# Patient Record
Sex: Female | Born: 1940
Health system: Southern US, Community
[De-identification: ages and names within clinical notes are randomized; demographics above are authoritative.]

## PROBLEM LIST (undated history)

## (undated) DIAGNOSIS — N644 Mastodynia: Secondary | ICD-10-CM

## (undated) DIAGNOSIS — B20 Human immunodeficiency virus [HIV] disease: Secondary | ICD-10-CM

## (undated) DIAGNOSIS — E785 Hyperlipidemia, unspecified: Secondary | ICD-10-CM

## (undated) DIAGNOSIS — Z21 Asymptomatic human immunodeficiency virus [HIV] infection status: Secondary | ICD-10-CM

## (undated) DIAGNOSIS — M1990 Unspecified osteoarthritis, unspecified site: Secondary | ICD-10-CM

## (undated) DIAGNOSIS — E119 Type 2 diabetes mellitus without complications: Secondary | ICD-10-CM

## (undated) DIAGNOSIS — K219 Gastro-esophageal reflux disease without esophagitis: Secondary | ICD-10-CM

## (undated) DIAGNOSIS — C884 Extranodal marginal zone b-cell lymphoma of mucosa-associated lymphoid tissue (malt-lymphoma) not having achieved remission: Secondary | ICD-10-CM

## (undated) DIAGNOSIS — I10 Essential (primary) hypertension: Secondary | ICD-10-CM

## (undated) HISTORY — PX: TUBAL LIGATION: SHX77

## (undated) HISTORY — PX: CATARACT EXTRACTION W/ INTRAOCULAR LENS  IMPLANT, BILATERAL: SHX1307

---

## 1983-11-28 HISTORY — PX: BREAST BIOPSY: SHX20

## 2006-07-13 ENCOUNTER — Ambulatory Visit: Payer: Self-pay | Admitting: Infectious Diseases

## 2006-07-13 ENCOUNTER — Other Ambulatory Visit: Payer: Self-pay

## 2006-08-31 ENCOUNTER — Ambulatory Visit: Payer: Self-pay | Admitting: Gastroenterology

## 2007-11-05 ENCOUNTER — Ambulatory Visit: Payer: Self-pay | Admitting: Specialist

## 2008-11-24 ENCOUNTER — Ambulatory Visit: Payer: Self-pay | Admitting: Specialist

## 2009-11-25 ENCOUNTER — Ambulatory Visit: Payer: Self-pay | Admitting: Specialist

## 2010-02-16 ENCOUNTER — Ambulatory Visit: Payer: Self-pay | Admitting: Specialist

## 2010-12-06 ENCOUNTER — Ambulatory Visit: Payer: Self-pay | Admitting: Specialist

## 2011-11-29 ENCOUNTER — Other Ambulatory Visit: Payer: Self-pay | Admitting: *Deleted

## 2011-11-30 MED ORDER — ENALAPRIL MALEATE 20 MG PO TABS: 20.0000 mg | ORAL_TABLET | Freq: Every day | ORAL | Status: DC

## 2011-12-26 ENCOUNTER — Ambulatory Visit: Payer: Self-pay | Admitting: Specialist

## 2012-02-02 DIAGNOSIS — I10 Essential (primary) hypertension: Secondary | ICD-10-CM | POA: Insufficient documentation

## 2012-02-02 DIAGNOSIS — Z21 Asymptomatic human immunodeficiency virus [HIV] infection status: Secondary | ICD-10-CM | POA: Insufficient documentation

## 2012-02-02 DIAGNOSIS — B20 Human immunodeficiency virus [HIV] disease: Secondary | ICD-10-CM | POA: Insufficient documentation

## 2012-04-01 ENCOUNTER — Other Ambulatory Visit: Payer: Self-pay | Admitting: Internal Medicine

## 2012-04-01 NOTE — Telephone Encounter (Signed)
Patient has not been seen here.

## 2012-04-08 MED ORDER — ENALAPRIL MALEATE 20 MG PO TABS: 20.0000 mg | ORAL_TABLET | Freq: Every day | ORAL | Status: DC

## 2012-04-08 NOTE — Telephone Encounter (Signed)
Refills authorized to August. No more after that without appt

## 2012-05-27 DIAGNOSIS — E119 Type 2 diabetes mellitus without complications: Secondary | ICD-10-CM | POA: Insufficient documentation

## 2012-06-17 ENCOUNTER — Ambulatory Visit: Payer: Self-pay | Admitting: Specialist

## 2012-06-27 ENCOUNTER — Ambulatory Visit: Payer: Self-pay | Admitting: Specialist

## 2012-07-28 ENCOUNTER — Ambulatory Visit: Payer: Self-pay | Admitting: Specialist

## 2012-08-26 ENCOUNTER — Telehealth: Payer: Self-pay | Admitting: Internal Medicine

## 2012-08-26 NOTE — Telephone Encounter (Signed)
AV#[id number] Enalapril maleate 20mg  tab 30.0 ea thirty Refills 3 Take 1 table (20mg  total) by mouth daily

## 2012-08-28 NOTE — Telephone Encounter (Signed)
Denied. Patient has not been seen. Last refill stated she needed appt before more refills. None scheduled.

## 2013-01-10 ENCOUNTER — Ambulatory Visit: Payer: Self-pay | Admitting: Specialist

## 2013-10-09 ENCOUNTER — Ambulatory Visit: Payer: Self-pay | Admitting: Gastroenterology

## 2013-11-06 ENCOUNTER — Ambulatory Visit: Payer: Self-pay | Admitting: Gastroenterology

## 2014-01-14 ENCOUNTER — Ambulatory Visit: Payer: Self-pay

## 2015-01-15 ENCOUNTER — Ambulatory Visit: Payer: Self-pay

## 2015-10-12 ENCOUNTER — Other Ambulatory Visit: Payer: Self-pay | Admitting: Infectious Diseases

## 2015-10-12 DIAGNOSIS — N644 Mastodynia: Secondary | ICD-10-CM

## 2015-10-28 ENCOUNTER — Ambulatory Visit
Admission: RE | Admit: 2015-10-28 | Discharge: 2015-10-28 | Disposition: A | Discharge status: Home / Self Care | Payer: Managed Care, Other (non HMO) | Source: Ambulatory Visit | Attending: Infectious Diseases | Admitting: Infectious Diseases

## 2015-10-28 ENCOUNTER — Other Ambulatory Visit: Payer: Self-pay | Admitting: Infectious Diseases

## 2015-10-28 DIAGNOSIS — R921 Mammographic calcification found on diagnostic imaging of breast: Secondary | ICD-10-CM | POA: Diagnosis not present

## 2015-10-28 DIAGNOSIS — N644 Mastodynia: Secondary | ICD-10-CM

## 2015-10-28 HISTORY — DX: Mastodynia: N64.4

## 2016-02-10 ENCOUNTER — Encounter
Admission: RE | Admit: 2016-02-10 | Discharge: 2016-02-10 | Disposition: A | Discharge status: Home / Self Care | Payer: Managed Care, Other (non HMO) | Source: Ambulatory Visit | Attending: Obstetrics and Gynecology | Admitting: Obstetrics and Gynecology

## 2016-02-10 DIAGNOSIS — Z01810 Encounter for preprocedural cardiovascular examination: Secondary | ICD-10-CM | POA: Insufficient documentation

## 2016-02-10 DIAGNOSIS — I519 Heart disease, unspecified: Secondary | ICD-10-CM | POA: Insufficient documentation

## 2016-02-10 DIAGNOSIS — I10 Essential (primary) hypertension: Secondary | ICD-10-CM | POA: Diagnosis not present

## 2016-02-10 DIAGNOSIS — E119 Type 2 diabetes mellitus without complications: Secondary | ICD-10-CM | POA: Diagnosis not present

## 2016-02-10 DIAGNOSIS — Z01812 Encounter for preprocedural laboratory examination: Secondary | ICD-10-CM | POA: Insufficient documentation

## 2016-02-10 HISTORY — DX: Unspecified osteoarthritis, unspecified site: M19.90

## 2016-02-10 HISTORY — DX: Type 2 diabetes mellitus without complications: E11.9

## 2016-02-10 HISTORY — DX: Extranodal marginal zone b-cell lymphoma of mucosa-associated lymphoid tissue (malt-lymphoma) not having achieved remission: C88.40

## 2016-02-10 HISTORY — DX: Asymptomatic human immunodeficiency virus (hiv) infection status: Z21

## 2016-02-10 HISTORY — DX: Extranodal marginal zone B-cell lymphoma of mucosa-associated lymphoid tissue (MALT-lymphoma): C88.4

## 2016-02-10 HISTORY — DX: Human immunodeficiency virus (HIV) disease: B20

## 2016-02-10 HISTORY — DX: Essential (primary) hypertension: I10

## 2016-02-10 HISTORY — DX: Hyperlipidemia, unspecified: E78.5

## 2016-02-10 LAB — TYPE AND SCREEN
ABO/RH(D): O POS
Antibody Screen: NEGATIVE

## 2016-02-10 LAB — CBC
HEMATOCRIT: 36.6 % (ref 35.0–47.0)
Hemoglobin: 12.5 g/dL (ref 12.0–16.0)
MCH: 37.3 pg — AB (ref 26.0–34.0)
MCHC: 34.1 g/dL (ref 32.0–36.0)
MCV: 109.3 fL — AB (ref 80.0–100.0)
PLATELETS: 225 10*3/uL (ref 150–440)
RBC: 3.35 MIL/uL — AB (ref 3.80–5.20)
RDW: 13.8 % (ref 11.5–14.5)
WBC: 5.3 10*3/uL (ref 3.6–11.0)

## 2016-02-10 LAB — BASIC METABOLIC PANEL
ANION GAP: 8 (ref 5–15)
BUN: 11 mg/dL (ref 6–20)
CALCIUM: 9.4 mg/dL (ref 8.9–10.3)
CO2: 25 mmol/L (ref 22–32)
Chloride: 103 mmol/L (ref 101–111)
Creatinine, Ser: 0.77 mg/dL (ref 0.44–1.00)
GLUCOSE: 85 mg/dL (ref 65–99)
POTASSIUM: 3.5 mmol/L (ref 3.5–5.1)
Sodium: 136 mmol/L (ref 135–145)

## 2016-02-10 LAB — APTT: APTT: 31 s (ref 24–36)

## 2016-02-10 LAB — PROTIME-INR
INR: 1.02
Prothrombin Time: 13.6 seconds (ref 11.4–15.0)

## 2016-02-10 NOTE — H&P (Signed)
[name] is a 75 y.o. female here for Pre Op Consulting .  HPI:  Pt presents for a preoperative visit to schedule a D&C, hysteroscopy, and polypectomy.  She has a hx of: postmenopausal bleeding  Workup has included:  The patient underwent a preliminary transvaginal pelvic sonogram. Saline u/s Endometrial polyp seen on saline exam=0.94 x 0.62 x 0.80 cm  Endometrium=8.88 mm Lt ant fibroid seen=25.3 mm bil ovs wnl   Pt has a hx of HIV+  Today worried about a dark bruise-like place in her left groin fold.Is seeing ID in 2 weeks. Has been putting a gel on it, prescription, but not listed in her medications and she can't remember the name. Is itchy, but not palapable.   Past Medical History:  has a past medical history of Abnormal LFTs (09/2013); Allergic rhinitis, cause unspecified; Diabetes mellitus type 2, uncomplicated (CMS-HCC); H/O tubal ligation; HIV infection (CMS-HCC) (1998); Hyperlipidemia, unspecified; Hypertension; MALT lymphoma (CMS-HCC); and Osteoarthrosis, unspecified whether generalized or localized, unspecified site.  Past Surgical History:  has a past surgical history that includes Breast excisional biopsy. Family History: family history includes Breast cancer in her mother; Diabetes type II in her brother; Hypertension in her brother; Pulmonary embolism in her father. Social History:  reports that she has never smoked. She has never used smokeless tobacco. She reports that she does not drink alcohol or use illicit drugs. OB/GYN History:  OB History    Gravida Para Term Preterm AB TAB SAB Ectopic Multiple Living   5 5 5       5       Allergies: has No Known Allergies. Medications:  Current Outpatient Prescriptions:  . amLODIPine (NORVASC) 2.5 MG tablet, Take 1 tablet (2.5 mg total) by mouth once daily., Disp: 90 tablet, Rfl: 3 . BESIVANCE 0.6 %, 1 DROP IN RIGHT EYE THREE TIMES A DAY, Disp: , Rfl: 1 . blood glucose diagnostic (ONETOUCH ULTRA  TEST) test strip, Use 1 each 2 (two) times daily. Use as instructed., Disp: 100 each, Rfl: 11 . calcium carb-vit D2-soybean 600-200-25 mg-unit-mg Tab, Take by mouth., Disp: , Rfl:  . clindamycin (CLINDAGEL) 1 % topical gel, Apply topically 2 (two) times daily. Apply to axilla two times a day, Disp: 30 g, Rfl: 11 . DUREZOL 0.05 % ophthalmic emulsion, PLACE 1 DROP IN RIGHT EYE THREE TIMES A DAY, Disp: , Rfl: 1 . enalapril (VASOTEC) 20 MG tablet, Take 1 tablet (20 mg total) by mouth once daily., Disp: 90 tablet, Rfl: 2 . glipiZIDE (GLUCOTROL) 5 MG tablet, Take 1 tablet (5 mg total) by mouth every morning before breakfast., Disp: 90 tablet, Rfl: 1 . ILEVRO 0.3 % DrpS, 1 DROP IN RIGHT EYE AT BEDTIME, Disp: , Rfl: 1 . lamiVUDine-zidovudine (COMBIVIR) 150-300 mg tablet, TAKE 1 TABLET DAILY, Disp: 90 tablet, Rfl: 1 . lancets (ONETOUCH DELICA LANCETS) 33 gauge Misc, Use 1 each 2 (two) times daily., Disp: 200 each, Rfl: 2 . lovastatin (MEVACOR) 40 MG tablet, Take 1 tablet (40 mg total) by mouth daily with dinner., Disp: 90 tablet, Rfl: 2 . metFORMIN (GLUMETZA) 500 MG (MOD) ER tablet, Take 1 tablet (500 mg total) by mouth 2 (two) times daily., Disp: 60 tablet, Rfl: 1 . metoprolol succinate (TOPROL-XL) 50 MG XL tablet, Take 2 tablets (100 mg total) by mouth once daily., Disp: 180 tablet, Rfl: 2 . omeprazole (PRILOSEC) 40 MG DR capsule, Take 1 capsule (40 mg total) by mouth once daily., Disp: 90 capsule, Rfl: 1 . predniSONE (DELTASONE) 20 MG tablet,  Take 3 daily for 2 days then 2 daily for 2 days then 1 daily for 2 days, Disp: 12 tablet, Rfl: 0 . raltegravir (ISENTRESS) 400 mg tablet, Take 1 tablet (400 mg total) by mouth 2 (two) times daily., Disp: 180 tablet, Rfl: 1  Review of Systems: See HPI   Exam:      Vitals:   01/18/16 1454  BP: 159/86  Pulse: 48    WDWN black female in NAD Body mass index is 25.71 kg/(m^2). Lungs: CTA  CV : RRR without murmur  Breast: deferred Neck: no  thyromegaly Abdomen: soft , no mass, normal active bowel sounds, non-tender, no rebound tenderness  Pelvic: External genitalia: vulva /labia no lesions, Tanner stage 5 Urethra: no prolapse Vagina: normal physiologic d/c Cervix: no lesions, no cervical motion tenderness  Uterus: normal size shape and contour, non-tender Adnexa: no mass, non-tender  Rectovaginal: external exam normal  Impression:   The primary encounter diagnosis was Preop examination. Diagnoses of Post-menopausal bleeding, Thickened endometrium, and Endometrial polyp were also pertinent to this visit.    Plan:   - Preoperative visit: D&C hysteroscopy polypectomy. Consents signed today. Risks of surgery were discussed with the patient including but not limited to: bleeding which may require transfusion; infection which may require antibiotics; injury to uterus or surrounding organs; intrauterine scarring which may impair future fertility; need for additional procedures including laparotomy or laparoscopy; and other postoperative/anesthesia complications. Written informed consent was obtained.  This is a scheduled same-day surgery. She will have a postop visit in 2 weeks to review operative findings and pathology.  - Will get coagulation factors on preop for bruised place on hip.

## 2016-02-10 NOTE — Pre-Procedure Instructions (Signed)
REVIEWED  EKG WITH DR Marcello Moores, NO CLEARANCE REQUESTED

## 2016-02-10 NOTE — Patient Instructions (Signed)
  Your procedure is scheduled on: Thursday 02/18/16 Report to Day Surgery. 2ND FLOOR MEDICAL MALL ENTRANCE To find out your arrival time please call [phone] between 1PM - 3PM on Wednesday 02/17/16.  Remember: Instructions that are not followed completely may result in serious medical risk, up to and including death, or upon the discretion of your surgeon and anesthesiologist your surgery may need to be rescheduled.    ____ 1. Do not eat food or drink liquids after midnight. No gum chewing or hard candies.     ____ 2. No Alcohol for 24 hours before or after surgery.   ____ 3. Bring all medications with you on the day of surgery if instructed.    ____ 4. Notify your doctor if there is any change in your medical condition     (cold, fever, infections).     Do not wear jewelry, make-up, hairpins, clips or nail polish.  Do not wear lotions, powders, or perfumes. You may wear deodorant.  Do not shave 48 hours prior to surgery. Men may shave face and neck.  Do not bring valuables to the hospital.    [facility] is not responsible for any belongings or valuables.               Contacts, dentures or bridgework may not be worn into surgery.  Leave your suitcase in the car. After surgery it may be brought to your room.  For patients admitted to the hospital, discharge time is determined by your                treatment team.   Patients discharged the day of surgery will not be allowed to drive home.   Please read over the following fact sheets that you were given:   Surgical Site Infection Prevention   __X__ Take these medicines the morning of surgery with A SIP OF WATER:    1. ENALAPRIL  2. LAMIVUDINE-ZIDOVUDINE  3. LOVASTATIN  4. METOPROLOL  5. OMEPRAZOLE  6. RALTEGRAVIR  ____ Fleet Enema (as directed)   ____ Use CHG Soap as directed  ____ Use inhalers on the day of surgery  __X__ Stop metformin 2 days prior to surgery    ____ Take 1/2 of usual insulin dose the night before  surgery and none on the morning of surgery.   __X__ Stop Coumadin/Plavix/aspirin on TODAY  __X__ Stop Anti-inflammatories on TODAY   ____ Stop supplements until after surgery.    ____ Bring C-Pap to the hospital.

## 2016-02-11 LAB — ABO/RH: ABO/RH(D): O POS

## 2016-02-14 NOTE — Pre-Procedure Instructions (Signed)
[place] FOR EKG CALLED AND FAXED AS REQUESTED BY DR J ADAMS TO DR [facility] OFFICE. SPOKE WITH DAWN ALSO CALLED AND FAXED TO DR BEASLEY OFFICE. SPOKE [place]

## 2016-02-15 NOTE — Pre-Procedure Instructions (Signed)
DR Ola Spurr REFERRED TO CARDIOLOGY FOR CLEARANCE

## 2016-02-17 NOTE — Pre-Procedure Instructions (Signed)
, Aloha Gell, MD - 02/17/2016 2:00 PM EDT Formatting of this note may be different from the original.   Chief Complaint: Chief Complaint  Patient presents with  . Establish Care  . Pre-op Exam  abnormal ekg  Date of Service: 02/17/2016 Date of Birth: 1941-11-16 PCP: DAVID Ashley Akin, MD  History of Present Illness: Ms. Tiet is a 75 y.o.female patient who presents for evaluation and risk stratification prior to elective surgery after preoperative electrocardiogram was felt to be abnormal. EKG done in March of this year revealed normal sinus rhythm at a rate of 63 with a PR interval of 158 ms, QRS duration 74 ms with a QTC of 399 ms with a QRS axis -8. There were nonspecific ST-T wave changes. Patient denies any significant chest pain. She remains fairly active without difficulty. She has a family history of heart disease and also has hypertension and hyperlipidemia as well as diabetes mellitus.  Past Medical and Surgical History  Past Medical History Past Medical History  Diagnosis Date  . Abnormal LFTs 09/2013  Follows with Dr. Rayann Heman  . Allergic rhinitis, cause unspecified  . Diabetes mellitus type 2, uncomplicated (CMS-HCC)  . H/O tubal ligation  Bilateral  . HIV infection (CMS-HCC) 1998  On treatment since diagnosis. Heterosexually acquired. No OIs.  Marland Kitchen Hyperlipidemia, unspecified  . Hypertension  . MALT lymphoma (CMS-HCC)  . Osteoarthrosis, unspecified whether generalized or localized, unspecified site   Past Surgical History She has a past surgical history that includes Breast excisional biopsy.   Medications and Allergies  Current Medications  Current Outpatient Prescriptions  Medication Sig Dispense Refill  . amLODIPine (NORVASC) 2.5 MG tablet Take 1 tablet (2.5 mg total) by mouth once daily. 90 tablet 3  . BESIVANCE 0.6 % 1 DROP IN RIGHT EYE THREE TIMES A DAY 1  . blood glucose diagnostic (ONETOUCH ULTRA TEST) test strip Use 1 each 2 (two) times daily. Use  as instructed. 100 each 11  . calcium carb-vit D2-soybean 600-200-25 mg-unit-mg Tab Take by mouth.  . DUREZOL 0.05 % ophthalmic emulsion PLACE 1 DROP IN RIGHT EYE THREE TIMES A DAY 1  . enalapril (VASOTEC) 20 MG tablet Take 1 tablet (20 mg total) by mouth once daily. 90 tablet 2  . glipiZIDE (GLUCOTROL) 5 MG tablet Take 1 tablet (5 mg total) by mouth every morning before breakfast. 90 tablet 1  . ILEVRO 0.3 % DrpS 1 DROP IN RIGHT EYE AT BEDTIME 1  . lamiVUDine-zidovudine (COMBIVIR) 150-300 mg tablet TAKE 1 TABLET DAILY 90 tablet 1  . lancets (ONETOUCH DELICA LANCETS) 33 gauge Misc Use 1 each 2 (two) times daily. 200 each 2  . lovastatin (MEVACOR) 40 MG tablet Take 1 tablet (40 mg total) by mouth daily with dinner. 90 tablet 2  . metFORMIN (GLUMETZA) 500 MG (MOD) ER tablet Take 1 tablet (500 mg total) by mouth 2 (two) times daily. 60 tablet 1  . metoprolol succinate (TOPROL-XL) 50 MG XL tablet Take 2 tablets (100 mg total) by mouth once daily. 180 tablet 2  . nystatin (MYCOSTATIN) 100,000 unit/gram ointment Apply topically 2 (two) times daily. 30 g 3  . omeprazole (PRILOSEC) 40 MG DR capsule Take 1 capsule (40 mg total) by mouth once daily. 90 capsule 1  . predniSONE (DELTASONE) 20 MG tablet Take 3 daily for 2 days then 2 daily for 2 days then 1 daily for 2 days 12 tablet 0  . raltegravir (ISENTRESS) 400 mg tablet Take 1 tablet (400 mg total) by  mouth 2 (two) times daily. 180 tablet 1   No current facility-administered medications for this visit.   Allergies: Review of patient's allergies indicates no known allergies.  Social and Family History  Social History reports that she has never smoked. She has never used smokeless tobacco. She reports that she does not drink alcohol or use illicit drugs.  Family History Family History  Problem Relation Age of Onset  . Pulmonary embolism Father  . Breast cancer Mother  . Diabetes type II Brother  . Hypertension Brother   Review of Systems   Review of Systems  Constitutional: Negative for chills, diaphoresis, fever, malaise/fatigue and weight loss.  HENT: Negative for congestion, ear discharge, hearing loss and tinnitus.  Eyes: Negative for blurred vision.  Respiratory: Negative for cough, hemoptysis, sputum production, shortness of breath and wheezing.  Cardiovascular: Positive for chest pain. Negative for palpitations, orthopnea, claudication, leg swelling and PND.  Gastrointestinal: Negative for abdominal pain, blood in stool, constipation, diarrhea, heartburn, melena, nausea and vomiting.  Genitourinary: Negative for dysuria, frequency, hematuria and urgency.  Musculoskeletal: Negative for back pain, falls, joint pain and myalgias.  Skin: Negative for itching and rash.  Neurological: Negative for dizziness, tingling, focal weakness, loss of consciousness, weakness and headaches.  Endo/Heme/Allergies: Negative for polydipsia. Does not bruise/bleed easily.  Psychiatric/Behavioral: Negative for depression, memory loss and substance abuse. The patient is not nervous/anxious.   Physical Examination   Vitals: Visit Vitals  . BP 130/84  . Pulse 76  . Resp 12  . Ht 162.6 cm (5\' 4" )  . Wt 68 kg (150 lb)  . LMP (LMP Unknown)  . BMI 25.75 kg/m2   Ht:162.6 cm (5\' 4" ) Wt:68 kg (150 lb) [id number] surface area is 1.75 meters squared. Body mass index is 25.75 kg/(m^2).  Wt Readings from Last 3 Encounters:  02/17/16 68 kg (150 lb)  02/01/16 67.6 kg (149 lb)  01/18/16 67.9 kg (149 lb 12.8 oz)   BP Readings from Last 3 Encounters:  02/17/16 130/84  02/01/16 135/85  01/18/16 159/86   General appearance appears in no acute distress  Head Mouth and Eye exam Normocephalic, without obvious abnormality, atraumatic Dentition is good Eyes appear anicteric   Neck exam Thyroid: normal  Nodes: no obvious adenopathy  LUNGS Breath Sounds: Normal Percussion: Normal  CARDIOVASCULAR JVP CV wave: no HJR: no Elevation at 90  degrees: None Carotid Pulse: normal pulsation bilaterally Bruit: None Apex: apical impulse normal  Auscultation Rhythm: normal sinus rhythm S1: normal S2: normal Clicks: no Rub: no Murmurs: no murmurs  Gallop: None ABDOMEN Liver enlargement: no Pulsatile aorta: no Ascites: no Bruits: no  EXTREMITIES Clubbing: no Edema: trace to 1+ bilateral pedal edema Pulses: peripheral pulses symmetrical Femoral Bruits: no Amputation: no SKIN Rash: no Cyanosis: no Embolic phemonenon: no Bruising: no NEURO Alert and Oriented to person, place and time: yes Non focal: yes  PSYCH: Pt appears to have normal affect  LABS REVIEWED Last 3 CBC results: Lab Results  Component Value Date  WBC 6.4 11/06/2014  WBC 7.4 06/23/2014   Lab Results  Component Value Date  HGB 13.0 11/06/2014  HGB 12.3 (L) 06/23/2014   Lab Results  Component Value Date  HCT 37.4 11/06/2014  HCT 34.7 (L) 06/23/2014   Lab Results  Component Value Date  PLT 268 11/06/2014  PLT 230 06/23/2014   Lab Results  Component Value Date  CREATININE 0.9 02/01/2016  BUN 16 02/01/2016  NA 142 02/01/2016  K 4.2 02/01/2016  CL 105 02/01/2016  CO2 27.1 02/01/2016   Lab Results  Component Value Date  HGBA1C 6.3 (H) 02/01/2016   Lab Results  Component Value Date  HDL 39.1 08/30/2015  HDL 38.0 06/23/2014   Lab Results  Component Value Date  LDLCALC 95 06/23/2014   Lab Results  Component Value Date  TRIG 418 (H) 08/30/2015  TRIG 305 (H) 06/23/2014   Lab Results  Component Value Date  ALT 16 02/01/2016  AST 19 02/01/2016  ALKPHOS 55 02/01/2016   Lab Results  Component Value Date  TSH 0.666 06/23/2014   Diagnostic Studies Reviewed:  EKG EKG demonstrated normal sinus rhythm, nonspecific ST and T waves changes.  Assessment and Plan   75 y.o. female with  ICD-10-CM ICD-9-CM  1. Atypical chest pain-atypical discomfort in her chest. We will proceed with an ETT to risk stratify prior to surgery.  R07.89 786.59 CARD stress test only, exercise  2. Essential hypertension, benign-continue with current regimen including enalapril and amlodipine for blood pressure control. Will also remain on metoprolol for heart rate control as well as blood pressure. DASH diet is recommended I10 401.1  3. Pure hypercholesterolemia-continue with lovastatin. LDL goal of less than 100. Low-fat diet recommended E78.00 272.0  4. Type 2 diabetes mellitus without complication, without long-term current use of insulin (CMS-HCC)-continue with oral agents with a hemoglobin A1c goal of less than 6. E11.9 250.00   ETT-showed no evidence of arrhythmia or ischemia. Patient low risk for surgery from a cardiac standpoint.  Return if symptoms worsen or fail to improve.  These notes generated with voice recognition software. I apologize for typographical errors.  Sydnee Levans, MD    Plan of Treatment - as of this encounter Upcoming Encounters

## 2016-02-17 NOTE — Pre-Procedure Instructions (Signed)
[facility] GOT REFERRAL FROM DR Ola Spurr FOR ABN EKG /HTN. CARDIOLOGY UNAWARE IT WAS FOR SURGICAL CLEARANCE. NOTIFIED JESSICA AT DR BEASLEY OFFICE THEY NEED TO CONTACT PATIENT AND TRY TO GET HER SEEN TODAY FOR CLEARANCE

## 2016-02-18 ENCOUNTER — Ambulatory Visit
Admission: RE | Admit: 2016-02-18 | Discharge: 2016-02-18 | Disposition: A | Discharge status: Home / Self Care | Payer: Managed Care, Other (non HMO) | Source: Ambulatory Visit | Attending: Obstetrics and Gynecology | Admitting: Obstetrics and Gynecology

## 2016-02-18 ENCOUNTER — Ambulatory Visit: Payer: Managed Care, Other (non HMO) | Admitting: Anesthesiology

## 2016-02-18 ENCOUNTER — Encounter: Payer: Self-pay | Admitting: *Deleted

## 2016-02-18 ENCOUNTER — Encounter
Admission: RE | Disposition: A | Discharge status: Home / Self Care | Payer: Self-pay | Source: Ambulatory Visit | Attending: Obstetrics and Gynecology

## 2016-02-18 DIAGNOSIS — M1990 Unspecified osteoarthritis, unspecified site: Secondary | ICD-10-CM | POA: Diagnosis not present

## 2016-02-18 DIAGNOSIS — N840 Polyp of corpus uteri: Secondary | ICD-10-CM | POA: Diagnosis not present

## 2016-02-18 DIAGNOSIS — Z8249 Family history of ischemic heart disease and other diseases of the circulatory system: Secondary | ICD-10-CM | POA: Insufficient documentation

## 2016-02-18 DIAGNOSIS — Z803 Family history of malignant neoplasm of breast: Secondary | ICD-10-CM | POA: Diagnosis not present

## 2016-02-18 DIAGNOSIS — E119 Type 2 diabetes mellitus without complications: Secondary | ICD-10-CM | POA: Diagnosis not present

## 2016-02-18 DIAGNOSIS — J309 Allergic rhinitis, unspecified: Secondary | ICD-10-CM | POA: Insufficient documentation

## 2016-02-18 DIAGNOSIS — I10 Essential (primary) hypertension: Secondary | ICD-10-CM | POA: Diagnosis not present

## 2016-02-18 DIAGNOSIS — N950 Postmenopausal bleeding: Secondary | ICD-10-CM | POA: Diagnosis present

## 2016-02-18 DIAGNOSIS — D250 Submucous leiomyoma of uterus: Secondary | ICD-10-CM | POA: Diagnosis not present

## 2016-02-18 DIAGNOSIS — R938 Abnormal findings on diagnostic imaging of other specified body structures: Secondary | ICD-10-CM | POA: Insufficient documentation

## 2016-02-18 DIAGNOSIS — Z833 Family history of diabetes mellitus: Secondary | ICD-10-CM | POA: Diagnosis not present

## 2016-02-18 DIAGNOSIS — E785 Hyperlipidemia, unspecified: Secondary | ICD-10-CM | POA: Diagnosis not present

## 2016-02-18 DIAGNOSIS — R918 Other nonspecific abnormal finding of lung field: Secondary | ICD-10-CM | POA: Diagnosis not present

## 2016-02-18 DIAGNOSIS — Z7984 Long term (current) use of oral hypoglycemic drugs: Secondary | ICD-10-CM | POA: Diagnosis not present

## 2016-02-18 DIAGNOSIS — Z836 Family history of other diseases of the respiratory system: Secondary | ICD-10-CM | POA: Insufficient documentation

## 2016-02-18 DIAGNOSIS — Z87891 Personal history of nicotine dependence: Secondary | ICD-10-CM | POA: Diagnosis not present

## 2016-02-18 DIAGNOSIS — Z8572 Personal history of non-Hodgkin lymphomas: Secondary | ICD-10-CM | POA: Diagnosis not present

## 2016-02-18 DIAGNOSIS — Z21 Asymptomatic human immunodeficiency virus [HIV] infection status: Secondary | ICD-10-CM | POA: Diagnosis not present

## 2016-02-18 HISTORY — PX: HYSTEROSCOPY WITH D & C: SHX1775

## 2016-02-18 LAB — GLUCOSE, CAPILLARY: GLUCOSE-CAPILLARY: 111 mg/dL — AB (ref 65–99)

## 2016-02-18 SURGERY — DILATATION AND CURETTAGE /HYSTEROSCOPY
Anesthesia: General | Wound class: Clean Contaminated

## 2016-02-18 MED ORDER — FENTANYL CITRATE (PF) 100 MCG/2ML IJ SOLN
INTRAMUSCULAR | Status: DC | PRN
  Administered 2016-02-18 (×4): 25 ug via INTRAVENOUS

## 2016-02-18 MED ORDER — PROPOFOL 10 MG/ML IV BOLUS
INTRAVENOUS | Status: DC | PRN
  Administered 2016-02-18: 40 mg via INTRAVENOUS
  Administered 2016-02-18: 150 mg via INTRAVENOUS

## 2016-02-18 MED ORDER — DOCUSATE SODIUM 100 MG PO CAPS: 100.0000 mg | ORAL_CAPSULE | Freq: Two times a day (BID) | ORAL | Status: DC | PRN

## 2016-02-18 MED ORDER — EPHEDRINE SULFATE 50 MG/ML IJ SOLN
INTRAMUSCULAR | Status: DC | PRN
  Administered 2016-02-18 (×2): 5 mg via INTRAVENOUS

## 2016-02-18 MED ORDER — FENTANYL CITRATE (PF) 100 MCG/2ML IJ SOLN: 25.0000 ug | INTRAMUSCULAR | Status: DC | PRN

## 2016-02-18 MED ORDER — DEXAMETHASONE SODIUM PHOSPHATE 10 MG/ML IJ SOLN
INTRAMUSCULAR | Status: DC | PRN
  Administered 2016-02-18: 10 mg via INTRAVENOUS

## 2016-02-18 MED ORDER — LIDOCAINE HCL (CARDIAC) 20 MG/ML IV SOLN
INTRAVENOUS | Status: DC | PRN
  Administered 2016-02-18: 100 mg via INTRAVENOUS

## 2016-02-18 MED ORDER — ONDANSETRON HCL 4 MG/2ML IJ SOLN
INTRAMUSCULAR | Status: DC | PRN
  Administered 2016-02-18: 4 mg via INTRAVENOUS

## 2016-02-18 MED ORDER — ONDANSETRON 4 MG PO TBDP: 4.0000 mg | ORAL_TABLET | Freq: Four times a day (QID) | ORAL | Status: DC | PRN

## 2016-02-18 MED ORDER — IBUPROFEN 800 MG PO TABS: 800.0000 mg | ORAL_TABLET | Freq: Three times a day (TID) | ORAL | Status: DC | PRN

## 2016-02-18 MED ORDER — PHENYLEPHRINE HCL 10 MG/ML IJ SOLN
INTRAMUSCULAR | Status: DC | PRN
  Administered 2016-02-18 (×2): 100 ug via INTRAVENOUS

## 2016-02-18 MED ORDER — SODIUM CHLORIDE 0.9 % IV SOLN
INTRAVENOUS | Status: DC
  Administered 2016-02-18: 10:00:00 via INTRAVENOUS

## 2016-02-18 MED ORDER — ONDANSETRON HCL 4 MG/2ML IJ SOLN: 4.0000 mg | Freq: Once | INTRAMUSCULAR | Status: DC | PRN

## 2016-02-18 MED ORDER — LACTATED RINGERS IV SOLN
INTRAVENOUS | Status: DC | PRN
  Administered 2016-02-18: 11:00:00 via INTRAVENOUS

## 2016-02-18 SURGICAL SUPPLY — 17 items
CANISTER SUCT 3000ML (MISCELLANEOUS) ×3 IMPLANT
CATH ROBINSON RED A/P 16FR (CATHETERS) ×3 IMPLANT
CORD URO TURP 10FT (MISCELLANEOUS) IMPLANT
ELECT LOOP MED HF 24F 12D (CUTTING LOOP) IMPLANT
ELECT REM PT RETURN 9FT ADLT (ELECTROSURGICAL) ×3
ELECT RESECT POWERBALL 24F (MISCELLANEOUS) IMPLANT
ELECTRODE REM PT RTRN 9FT ADLT (ELECTROSURGICAL) ×2 IMPLANT
GLOVE BIO SURGEON STRL SZ 6.5 (GLOVE) ×12 IMPLANT
GLOVE INDICATOR 7.0 STRL GRN (GLOVE) ×9 IMPLANT
GOWN STRL REUS W/ TWL LRG LVL3 (GOWN DISPOSABLE) ×4 IMPLANT
GOWN STRL REUS W/TWL LRG LVL3 (GOWN DISPOSABLE) ×2
IV LACTATED RINGERS 1000ML (IV SOLUTION) ×3 IMPLANT
KIT RM TURNOVER CYSTO AR (KITS) ×3 IMPLANT
PACK DNC HYST (MISCELLANEOUS) ×3 IMPLANT
PAD OB MATERNITY 4.3X12.25 (PERSONAL CARE ITEMS) ×3 IMPLANT
PAD PREP 24X41 OB/GYN DISP (PERSONAL CARE ITEMS) ×3 IMPLANT
TUBING CONNECTING 10 (TUBING) ×3 IMPLANT

## 2016-02-18 NOTE — Transfer of Care (Signed)
Immediate Anesthesia Transfer of Care Note  Patient: Janet Keller  Procedure(s) Performed: Procedure(s): Fractional DILATATION AND CURETTAGE /HYSTEROSCOPY , Polypectomy and myomectomy (N/A) EXAM UNDER ANESTHESIA  Patient Location: PACU  Anesthesia Type:General  Level of Consciousness: awake, alert  and oriented  Airway & Oxygen Therapy: Patient Spontanous Breathing  Post-op Assessment: Report given to RN and Post -op Vital signs reviewed and stable  Post vital signs: Reviewed and stable  Last Vitals:  Filed Vitals:   02/18/16 1017 02/18/16 1230  BP: 161/116 145/79  Pulse: 63 71  Temp: 36.4 C 36.2 C  Resp: 16 13    Complications: No apparent anesthesia complications

## 2016-02-18 NOTE — Anesthesia Preprocedure Evaluation (Signed)
Anesthesia Evaluation  Patient identified by MRN, date of birth, ID band Patient awake    Reviewed: Allergy & Precautions, NPO status , Patient's Chart, lab work & pertinent test results  History of Anesthesia Complications Negative for: history of anesthetic complications  Airway Mallampati: III       Dental  (+) Partial Upper   Pulmonary neg pulmonary ROS, former smoker,           Cardiovascular hypertension, Pt. on medications and Pt. on home beta blockers      Neuro/Psych negative neurological ROS     GI/Hepatic negative GI ROS, Neg liver ROS,   Endo/Other  diabetes, Type 2, Oral Hypoglycemic Agents  Renal/GU negative Renal ROS     Musculoskeletal  (+) Arthritis , Osteoarthritis,    Abdominal   Peds  Hematology negative hematology ROS (+)   Anesthesia Other Findings   Reproductive/Obstetrics                             Anesthesia Physical Anesthesia Plan  ASA: III  Anesthesia Plan: General   Post-op Pain Management:    Induction: Intravenous  Airway Management Planned: LMA  Additional Equipment:   Intra-op Plan:   Post-operative Plan:   Informed Consent: I have reviewed the patients History and Physical, chart, labs and discussed the procedure including the risks, benefits and alternatives for the proposed anesthesia with the patient or authorized representative who has indicated his/her understanding and acceptance.     Plan Discussed with:   Anesthesia Plan Comments:         Anesthesia Quick Evaluation

## 2016-02-18 NOTE — Interval H&P Note (Signed)
History and Physical Interval Note:  02/18/2016 11:13 AM  Janet Keller  has presented today for surgery, with the diagnosis of postmenopausal bleeding  The various methods of treatment have been discussed with the patient and family. After consideration of risks, benefits and other options for treatment, the patient has consented to  Procedure(s): DILATATION AND CURETTAGE /HYSTEROSCOPY , Polypectomy (N/A) as a surgical intervention .  The patient's history has been reviewed, patient examined, no change in status, stable for surgery.  I have reviewed the patient's chart and labs.  Questions were answered to the patient's satisfaction.     Benjaman Kindler

## 2016-02-18 NOTE — Discharge Instructions (Signed)
AMBULATORY SURGERY  °DISCHARGE INSTRUCTIONS ° ° °1) The drugs that you were given will stay in your system until tomorrow so for the next 24 hours you should not: ° °A) Drive an automobile °B) Make any legal decisions °C) Drink any alcoholic beverage ° ° °2) You may resume regular meals tomorrow.  Today it is better to start with liquids and gradually work up to solid foods. ° °You may eat anything you prefer, but it is better to start with liquids, then soup and crackers, and gradually work up to solid foods. ° ° °3) Please notify your doctor immediately if you have any unusual bleeding, trouble breathing, redness and pain at the surgery site, drainage, fever, or pain not relieved by medication. ° ° ° °4) Additional Instructions: ° ° ° ° ° ° ° °Please contact your physician with any problems or Same Day Surgery at [phone], Monday through Friday 6 am to 4 pm, or [place] [facility] [place] Main number at [phone].AMBULATORY SURGERY  °DISCHARGE INSTRUCTIONS ° ° °5) The drugs that you were given will stay in your system until tomorrow so for the next 24 hours you should not: ° °D) Drive an automobile °E) Make any legal decisions °F) Drink any alcoholic beverage ° ° °6) You may resume regular meals tomorrow.  Today it is better to start with liquids and gradually work up to solid foods. ° °You may eat anything you prefer, but it is better to start with liquids, then soup and crackers, and gradually work up to solid foods. ° ° °7) Please notify your doctor immediately if you have any unusual bleeding, trouble breathing, redness and pain at the surgery site, drainage, fever, or pain not relieved by medication. ° ° ° °8) Additional Instructions: ° ° ° ° ° ° ° °Please contact your physician with any problems or Same Day Surgery at [phone], Monday through Friday 6 am to 4 pm, or [place] [facility] [place] Main number at [phone].AMBULATORY SURGERY  °DISCHARGE INSTRUCTIONS ° ° °9) The drugs that you were given  will stay in your system until tomorrow so for the next 24 hours you should not: ° °G) Drive an automobile °H) Make any legal decisions °I) Drink any alcoholic beverage ° ° °10) You may resume regular meals tomorrow.  Today it is better to start with liquids and gradually work up to solid foods. ° °You may eat anything you prefer, but it is better to start with liquids, then soup and crackers, and gradually work up to solid foods. ° ° °11) Please notify your doctor immediately if you have any unusual bleeding, trouble breathing, redness and pain at the surgery site, drainage, fever, or pain not relieved by medication. ° ° ° °12) Additional Instructions: ° ° ° ° ° ° ° °Please contact your physician with any problems or Same Day Surgery at [phone], Monday through Friday 6 am to 4 pm, or [place] at [place] Main number at [phone]. °

## 2016-02-18 NOTE — Op Note (Signed)
Operative Report Hysteroscopy with Dilation and Curettage   Indications: Postmenopausal bleeding   Pre-operative Diagnosis:  Endometrial polyp   Post-operative Diagnosis: same plus submucosal fibroid  Procedure: 1. Exam under anesthesia 2. Fractional D&C 3. Hysteroscopy 4. Polypectomy 5. Myomectomy  Surgeon: Benjaman Kindler, MD  Assistant(s):  None  Anesthesia: General LMA anesthesia  Anesthesiologist: Gunnar Fusi, MD Anesthesiologist: Gunnar Fusi, MD CRNA: Marsh Dolly, CRNA  Estimated Blood Loss:  Minimal  Total IV Fluids: 81ml  Urine Output: 117ml  Total Fluid Deficit:  160 mL          Specimens: Endocervical curettings, endometrial curettings         Complications:  None; patient tolerated the procedure well.         Disposition: PACU - hemodynamically stable.         Condition: stable  Findings: Uterus measuring 8 cm by sound; normal cervix, vagina, perineum. Several small endometrial polyps and 2 endometrial fibroids. A left lateral fibroid appeared to have an intramural component and was only partially resected. It was very firm and seemed to have a yellow component inside, and this was left alone. Her overall endometrium was atrophic.  Indication for procedure/Consents: 75 y.o. F  here for scheduled surgery for the aforementioned diagnoses.   Risks of surgery were discussed with the patient including but not limited to: bleeding which may require transfusion; infection which may require antibiotics; injury to uterus or surrounding organs; intrauterine scarring which may impair future fertility; need for additional procedures including laparotomy or laparoscopy; and other postoperative/anesthesia complications. Written informed consent was obtained.    Procedure Details:   D&C/Polypectomy The patient was taken to the operating room where she was positioned while awake to decrease risk of injuring her lower back, as she has had pain there before.  LMA anesthesia was administered and was found to be adequate. After a formal and adequate timeout was performed, she was prepped and draped in the sterile manner. Her bladder was catheterized for an estimated amount of clear, yellow urine. A weighed speculum was then placed in the patient's vagina and a single tooth tenaculum was applied to the anterior lip of the cervix.  Her cervix was serially dilated to 15 [place] using Hanks dilators. An ECC was performed. Her uterus was sounded to 8 cm. The hysteroscope was introduced to reveal the above findings. Endometrial polypectomy and myomectomy was performed with the Myosure device, after a sharp curettage revealed firm nodules that were unable to be resected with a sharp curette.   A sharp curettage was then performed until there was a gritty texture in all four quadrants. The tenaculum was removed from the anterior lip of the cervix and the vaginal speculum was removed after assuring good hemostasis.   The patient tolerated the procedure well and was taken to the recovery area awake and in stable condition. She received iv acetaminophen and Toradol prior to leaving the OR.  The patient will be discharged to home as per PACU criteria. Routine postoperative instructions given. She was prescribed Ibuprofen and Colace. She will follow up in the clinic in two weeks for postoperative evaluation.

## 2016-02-18 NOTE — Anesthesia Postprocedure Evaluation (Signed)
Anesthesia Post Note  Patient: Janet Keller  Procedure(s) Performed: Procedure(s) (LRB): Fractional DILATATION AND CURETTAGE /HYSTEROSCOPY , Polypectomy and myomectomy (N/A) EXAM UNDER ANESTHESIA  Patient location during evaluation: PACU Anesthesia Type: General Level of consciousness: awake and alert Pain management: pain level controlled Vital Signs Assessment: post-procedure vital signs reviewed and stable Respiratory status: spontaneous breathing and respiratory function stable Cardiovascular status: blood pressure returned to baseline and stable Anesthetic complications: no    Last Vitals:  Filed Vitals:   02/18/16 1313 02/18/16 1352  BP: 176/88 156/76  Pulse: 65 70  Temp: 35.9 C   Resp: 14     Last Pain:  Filed Vitals:   02/18/16 1353  PainSc: Asleep                 KEPHART,WILLIAM K

## 2016-02-18 NOTE — Anesthesia Procedure Notes (Signed)
Procedure Name: LMA Insertion Date/Time: 02/18/2016 11:46 AM Performed by: Marsh Dolly Pre-anesthesia Checklist: Patient identified, Patient being monitored, Timeout performed, Emergency Drugs available and Suction available Patient Re-evaluated:Patient Re-evaluated prior to inductionOxygen Delivery Method: Circle system utilized Preoxygenation: Pre-oxygenation with 100% oxygen Intubation Type: IV induction Ventilation: Mask ventilation without difficulty LMA: LMA inserted LMA Size: 3.5 Tube type: Oral Number of attempts: 1 Placement Confirmation: positive ETCO2 and breath sounds checked- equal and bilateral Tube secured with: Tape Dental Injury: Teeth and Oropharynx as per pre-operative assessment

## 2016-02-21 LAB — SURGICAL PATHOLOGY

## 2016-08-01 DIAGNOSIS — E785 Hyperlipidemia, unspecified: Secondary | ICD-10-CM | POA: Insufficient documentation

## 2016-10-10 ENCOUNTER — Other Ambulatory Visit: Payer: Self-pay | Admitting: Infectious Diseases

## 2016-10-10 DIAGNOSIS — Z1231 Encounter for screening mammogram for malignant neoplasm of breast: Secondary | ICD-10-CM

## 2016-11-22 ENCOUNTER — Ambulatory Visit
Admission: RE | Admit: 2016-11-22 | Discharge: 2016-11-22 | Disposition: A | Discharge status: Home / Self Care | Payer: Managed Care, Other (non HMO) | Source: Ambulatory Visit | Attending: Infectious Diseases | Admitting: Infectious Diseases

## 2016-11-22 DIAGNOSIS — Z1231 Encounter for screening mammogram for malignant neoplasm of breast: Secondary | ICD-10-CM | POA: Diagnosis not present

## 2017-11-01 ENCOUNTER — Other Ambulatory Visit: Payer: Self-pay | Admitting: Obstetrics and Gynecology

## 2017-11-01 DIAGNOSIS — Z1231 Encounter for screening mammogram for malignant neoplasm of breast: Secondary | ICD-10-CM

## 2017-11-29 ENCOUNTER — Ambulatory Visit
Admission: RE | Admit: 2017-11-29 | Discharge: 2017-11-29 | Disposition: A | Discharge status: Home / Self Care | Payer: Managed Care, Other (non HMO) | Source: Ambulatory Visit | Attending: Obstetrics and Gynecology | Admitting: Obstetrics and Gynecology

## 2017-11-29 DIAGNOSIS — Z1231 Encounter for screening mammogram for malignant neoplasm of breast: Secondary | ICD-10-CM | POA: Diagnosis not present

## 2017-12-11 ENCOUNTER — Other Ambulatory Visit: Payer: Self-pay | Admitting: Obstetrics and Gynecology

## 2017-12-11 DIAGNOSIS — N6320 Unspecified lump in the left breast, unspecified quadrant: Secondary | ICD-10-CM

## 2017-12-18 ENCOUNTER — Ambulatory Visit
Admission: RE | Admit: 2017-12-18 | Discharge: 2017-12-18 | Disposition: A | Discharge status: Home / Self Care | Payer: Managed Care, Other (non HMO) | Source: Ambulatory Visit | Attending: Obstetrics and Gynecology | Admitting: Obstetrics and Gynecology

## 2017-12-18 DIAGNOSIS — N6320 Unspecified lump in the left breast, unspecified quadrant: Secondary | ICD-10-CM

## 2018-01-28 ENCOUNTER — Other Ambulatory Visit: Payer: Self-pay | Admitting: Podiatry

## 2018-01-28 ENCOUNTER — Other Ambulatory Visit: Payer: Self-pay

## 2018-01-28 ENCOUNTER — Encounter
Admission: RE | Admit: 2018-01-28 | Discharge: 2018-01-28 | Disposition: A | Discharge status: Home / Self Care | Payer: 59 | Source: Ambulatory Visit | Attending: Podiatry | Admitting: Podiatry

## 2018-01-28 DIAGNOSIS — Z01812 Encounter for preprocedural laboratory examination: Secondary | ICD-10-CM | POA: Diagnosis not present

## 2018-01-28 DIAGNOSIS — Z01810 Encounter for preprocedural cardiovascular examination: Secondary | ICD-10-CM | POA: Diagnosis not present

## 2018-01-28 HISTORY — DX: Gastro-esophageal reflux disease without esophagitis: K21.9

## 2018-01-28 LAB — CBC
HEMATOCRIT: 36.8 % (ref 35.0–47.0)
Hemoglobin: 12.4 g/dL (ref 12.0–16.0)
MCH: 37.2 pg — AB (ref 26.0–34.0)
MCHC: 33.8 g/dL (ref 32.0–36.0)
MCV: 110.3 fL — ABNORMAL HIGH (ref 80.0–100.0)
Platelets: 245 10*3/uL (ref 150–440)
RBC: 3.34 MIL/uL — ABNORMAL LOW (ref 3.80–5.20)
RDW: 14 % (ref 11.5–14.5)
WBC: 6.8 10*3/uL (ref 3.6–11.0)

## 2018-01-28 LAB — BASIC METABOLIC PANEL
Anion gap: 10 (ref 5–15)
BUN: 15 mg/dL (ref 6–20)
CHLORIDE: 107 mmol/L (ref 101–111)
CO2: 24 mmol/L (ref 22–32)
Calcium: 9.6 mg/dL (ref 8.9–10.3)
Creatinine, Ser: 0.91 mg/dL (ref 0.44–1.00)
GFR calc non Af Amer: 59 mL/min — ABNORMAL LOW (ref >60)
GLUCOSE: 91 mg/dL (ref 65–99)
Potassium: 3.9 mmol/L (ref 3.5–5.1)
SODIUM: 141 mmol/L (ref 135–145)

## 2018-01-28 NOTE — Patient Instructions (Signed)
Your procedure is scheduled on: Friday 02/01/18 Report to [place]. To find out your arrival time please call [phone] between 1PM - 3PM on Thursday 01/31/18.  Remember: Instructions that are not followed completely may result in serious medical risk, up to and including death, or upon the discretion of your surgeon and anesthesiologist your surgery may need to be rescheduled.     _X__ 1. Do not eat food after midnight the night before your procedure.                 No gum chewing or hard candies. You may drink clear liquids up to 2 hours                 before you are scheduled to arrive for your surgery- DO not drink clear                 liquids within 2 hours of the start of your surgery.                 Clear Liquids include:  water, apple juice without pulp, clear carbohydrate                 drink such as Clearfast or Gatorade, Black Coffee or Tea (Do not add                 anything to coffee or tea).  __X__2.  On the morning of surgery brush your teeth with toothpaste and water, you                 may rinse your mouth with mouthwash if you wish.  Do not swallow any              toothpaste of mouthwash.     _X__ 3.  No Alcohol for 24 hours before or after surgery.   _X__ 4.  Do Not Smoke or use e-cigarettes For 24 Hours Prior to Your Surgery.                 Do not use any chewable tobacco products for at least 6 hours prior to                 surgery.  ____  5.  Bring all medications with you on the day of surgery if instructed.   __X__  6.  Notify your doctor if there is any change in your medical condition      (cold, fever, infections).     Do not wear jewelry, make-up, hairpins, clips or nail polish. Do not wear lotions, powders, or perfumes.  Do not shave 48 hours prior to surgery. Men may shave face and neck. Do not bring valuables to the hospital.    [facility] is not responsible for any belongings or  valuables.  Contacts, dentures/partials or body piercings may not be worn into surgery. Bring a case for your contacts, glasses or hearing aids, a denture cup will be supplied. Leave your suitcase in the car. After surgery it may be brought to your room. For patients admitted to the hospital, discharge time is determined by your treatment team.   Patients discharged the day of surgery will not be allowed to drive home.   Please read over the following fact sheets that you were given:   MRSA Information  __X__ Take these medicines the morning of surgery with A SIP OF WATER:  1. COMBIVIR  2. METOPROLOL  3. OMEPRAZOLE  4. ISENTRESS OR BIKTARVY IF AVAILABLE  5.  6.  ____ Fleet Enema (as directed)   __X__ Use CHG Soap/SAGE wipes as directed  ____ Use inhalers on the day of surgery  __X__ Stop metformin/Janumet/Farxiga 2 days prior to surgery    ____ Take 1/2 of usual insulin dose the night before surgery. No insulin the morning          of surgery.   ____ Stop Blood Thinners Coumadin/Plavix/Xarelto/Pleta/Pradaxa/Eliquis/Effient/Aspirin  on   Or contact your Surgeon, Cardiologist or Medical Doctor regarding  ability to stop your blood thinners  __X__ Stop Anti-inflammatories 7 days before surgery such as Advil, Ibuprofen, Motrin,  BC or Goodies Powder, Naprosyn, Naproxen, Aleve, Aspirin MAY USE TYLENOL   __X__ Stop all herbal supplements, fish oil or vitamin E until after surgery.    ____ Bring C-Pap to the hospital.

## 2018-01-31 MED ORDER — CEFAZOLIN SODIUM-DEXTROSE 2-4 GM/100ML-% IV SOLN
2.0000 g | INTRAVENOUS | Status: AC
  Administered 2018-02-01: 2 g via INTRAVENOUS

## 2018-02-01 ENCOUNTER — Encounter
Admission: RE | Disposition: A | Discharge status: Home / Self Care | Payer: Self-pay | Source: Ambulatory Visit | Attending: Podiatry

## 2018-02-01 ENCOUNTER — Encounter: Payer: Self-pay | Admitting: *Deleted

## 2018-02-01 ENCOUNTER — Ambulatory Visit: Payer: 59 | Admitting: Anesthesiology

## 2018-02-01 ENCOUNTER — Other Ambulatory Visit: Payer: Self-pay

## 2018-02-01 ENCOUNTER — Ambulatory Visit
Admission: RE | Admit: 2018-02-01 | Discharge: 2018-02-01 | Disposition: A | Discharge status: Home / Self Care | Payer: 59 | Source: Ambulatory Visit | Attending: Podiatry | Admitting: Podiatry

## 2018-02-01 DIAGNOSIS — M1A9XX1 Chronic gout, unspecified, with tophus (tophi): Secondary | ICD-10-CM | POA: Diagnosis not present

## 2018-02-01 DIAGNOSIS — M2042 Other hammer toe(s) (acquired), left foot: Secondary | ICD-10-CM | POA: Diagnosis not present

## 2018-02-01 DIAGNOSIS — M899 Disorder of bone, unspecified: Secondary | ICD-10-CM | POA: Diagnosis not present

## 2018-02-01 DIAGNOSIS — E785 Hyperlipidemia, unspecified: Secondary | ICD-10-CM | POA: Diagnosis not present

## 2018-02-01 DIAGNOSIS — Z9842 Cataract extraction status, left eye: Secondary | ICD-10-CM | POA: Diagnosis not present

## 2018-02-01 DIAGNOSIS — Z9841 Cataract extraction status, right eye: Secondary | ICD-10-CM | POA: Diagnosis not present

## 2018-02-01 DIAGNOSIS — Z87891 Personal history of nicotine dependence: Secondary | ICD-10-CM | POA: Insufficient documentation

## 2018-02-01 DIAGNOSIS — E118 Type 2 diabetes mellitus with unspecified complications: Secondary | ICD-10-CM | POA: Diagnosis not present

## 2018-02-01 DIAGNOSIS — M1990 Unspecified osteoarthritis, unspecified site: Secondary | ICD-10-CM | POA: Diagnosis not present

## 2018-02-01 DIAGNOSIS — Z8572 Personal history of non-Hodgkin lymphomas: Secondary | ICD-10-CM | POA: Diagnosis not present

## 2018-02-01 DIAGNOSIS — M2041 Other hammer toe(s) (acquired), right foot: Secondary | ICD-10-CM | POA: Insufficient documentation

## 2018-02-01 DIAGNOSIS — M2040 Other hammer toe(s) (acquired), unspecified foot: Secondary | ICD-10-CM | POA: Diagnosis present

## 2018-02-01 DIAGNOSIS — K219 Gastro-esophageal reflux disease without esophagitis: Secondary | ICD-10-CM | POA: Insufficient documentation

## 2018-02-01 HISTORY — PX: HAMMER TOE SURGERY: SHX385

## 2018-02-01 LAB — GLUCOSE, CAPILLARY
GLUCOSE-CAPILLARY: 143 mg/dL — AB (ref 65–99)
GLUCOSE-CAPILLARY: 130 mg/dL — AB (ref 65–99)

## 2018-02-01 SURGERY — CORRECTION, HAMMER TOE
Anesthesia: General | Site: Foot | Laterality: Bilateral | Wound class: Clean

## 2018-02-01 MED ORDER — LIDOCAINE HCL (PF) 2 % IJ SOLN
INTRAMUSCULAR | Status: AC
  Filled 2018-02-01: qty 10

## 2018-02-01 MED ORDER — PROPOFOL 10 MG/ML IV BOLUS
INTRAVENOUS | Status: AC
Start: 2018-02-01 — End: 2018-02-01
  Filled 2018-02-01: qty 20

## 2018-02-01 MED ORDER — FENTANYL CITRATE (PF) 100 MCG/2ML IJ SOLN
INTRAMUSCULAR | Status: AC
  Filled 2018-02-01: qty 2

## 2018-02-01 MED ORDER — OXYCODONE HCL 5 MG/5ML PO SOLN: 5.0000 mg | Freq: Once | ORAL | Status: DC | PRN

## 2018-02-01 MED ORDER — BUPIVACAINE HCL (PF) 0.5 % IJ SOLN
INTRAMUSCULAR | Status: AC
  Filled 2018-02-01: qty 30

## 2018-02-01 MED ORDER — HYDROCODONE-ACETAMINOPHEN 5-325 MG PO TABS: 1.0000 | ORAL_TABLET | ORAL | 0 refills | Status: AC | PRN

## 2018-02-01 MED ORDER — SODIUM CHLORIDE 0.9 % IV SOLN
INTRAVENOUS | Status: DC
  Administered 2018-02-01 (×2): via INTRAVENOUS

## 2018-02-01 MED ORDER — ONDANSETRON HCL 4 MG/2ML IJ SOLN
INTRAMUSCULAR | Status: DC | PRN
  Administered 2018-02-01: 4 mg via INTRAVENOUS

## 2018-02-01 MED ORDER — CEFAZOLIN SODIUM-DEXTROSE 2-4 GM/100ML-% IV SOLN
INTRAVENOUS | Status: AC
  Filled 2018-02-01: qty 100

## 2018-02-01 MED ORDER — OXYCODONE HCL 5 MG PO TABS: 5.0000 mg | ORAL_TABLET | Freq: Once | ORAL | Status: DC | PRN

## 2018-02-01 MED ORDER — MIDAZOLAM HCL 2 MG/2ML IJ SOLN
INTRAMUSCULAR | Status: AC
  Filled 2018-02-01: qty 2

## 2018-02-01 MED ORDER — POVIDONE-IODINE 7.5 % EX SOLN: Freq: Once | CUTANEOUS | Status: DC

## 2018-02-01 MED ORDER — PROPOFOL 10 MG/ML IV BOLUS
INTRAVENOUS | Status: DC | PRN
  Administered 2018-02-01: 140 mg via INTRAVENOUS

## 2018-02-01 MED ORDER — MIDAZOLAM HCL 2 MG/2ML IJ SOLN
INTRAMUSCULAR | Status: DC | PRN
  Administered 2018-02-01: 1 mg via INTRAVENOUS

## 2018-02-01 MED ORDER — ONDANSETRON HCL 4 MG/2ML IJ SOLN
INTRAMUSCULAR | Status: AC
  Filled 2018-02-01: qty 2

## 2018-02-01 MED ORDER — BUPIVACAINE HCL 0.5 % IJ SOLN
INTRAMUSCULAR | Status: DC | PRN
  Administered 2018-02-01: 10 mL

## 2018-02-01 MED ORDER — LIDOCAINE HCL (CARDIAC) 20 MG/ML IV SOLN
INTRAVENOUS | Status: DC | PRN
  Administered 2018-02-01: 40 mg via INTRAVENOUS

## 2018-02-01 MED ORDER — FENTANYL CITRATE (PF) 100 MCG/2ML IJ SOLN
INTRAMUSCULAR | Status: DC | PRN
  Administered 2018-02-01 (×2): 25 ug via INTRAVENOUS
  Administered 2018-02-01: 50 ug via INTRAVENOUS

## 2018-02-01 MED ORDER — EPHEDRINE SULFATE 50 MG/ML IJ SOLN
INTRAMUSCULAR | Status: DC | PRN
  Administered 2018-02-01 (×2): 10 mg via INTRAVENOUS

## 2018-02-01 MED ORDER — PHENYLEPHRINE HCL 10 MG/ML IJ SOLN
INTRAMUSCULAR | Status: DC | PRN
  Administered 2018-02-01 (×8): 100 ug via INTRAVENOUS

## 2018-02-01 MED ORDER — FENTANYL CITRATE (PF) 100 MCG/2ML IJ SOLN: 25.0000 ug | INTRAMUSCULAR | Status: DC | PRN

## 2018-02-01 SURGICAL SUPPLY — 37 items
BLADE OSC/SAGITTAL MD 5.5X18 (BLADE) ×2 IMPLANT
BLADE SURG 15 STRL LF DISP TIS (BLADE) ×2 IMPLANT
BLADE SURG 15 STRL SS (BLADE) ×2
BLADE SURG MINI STRL (BLADE) ×4 IMPLANT
BNDG ESMARK 4X12 TAN STRL LF (GAUZE/BANDAGES/DRESSINGS) ×2 IMPLANT
CANISTER SUCT 1200ML W/VALVE (MISCELLANEOUS) ×2 IMPLANT
CUFF TOURN 18 STER (MISCELLANEOUS) IMPLANT
CUFF TOURN DUAL PL 12 NO SLV (MISCELLANEOUS) ×6 IMPLANT
DRAPE BILAT LIMB 76X120 89291 (MISCELLANEOUS) ×2 IMPLANT
DRAPE FLUOR MINI C-ARM 54X84 (DRAPES) ×2 IMPLANT
DURAPREP 26ML APPLICATOR (WOUND CARE) ×2 IMPLANT
ELECT REM PT RETURN 9FT ADLT (ELECTROSURGICAL) ×2
ELECTRODE REM PT RTRN 9FT ADLT (ELECTROSURGICAL) ×1 IMPLANT
GAUZE PETRO XEROFOAM 1X8 (MISCELLANEOUS) ×2 IMPLANT
GAUZE SPONGE 4X4 12PLY STRL (GAUZE/BANDAGES/DRESSINGS) ×2 IMPLANT
GAUZE STRETCH 2X75IN STRL (MISCELLANEOUS) ×2 IMPLANT
GLOVE BIO SURGEON STRL SZ7.5 (GLOVE) ×2 IMPLANT
GLOVE INDICATOR 8.0 STRL GRN (GLOVE) ×2 IMPLANT
GOWN STRL REUS W/ TWL LRG LVL3 (GOWN DISPOSABLE) ×3 IMPLANT
GOWN STRL REUS W/TWL LRG LVL3 (GOWN DISPOSABLE) ×3
LABEL OR SOLS (LABEL) ×2 IMPLANT
NEEDLE FILTER BLUNT 18X 1/2SAF (NEEDLE) ×1
NEEDLE FILTER BLUNT 18X1 1/2 (NEEDLE) ×1 IMPLANT
NEEDLE HYPO 25X1 1.5 SAFETY (NEEDLE) ×4 IMPLANT
NS IRRIG 500ML POUR BTL (IV SOLUTION) ×2 IMPLANT
PACK EXTREMITY ARMC (MISCELLANEOUS) ×2 IMPLANT
RASP SM TEAR CROSS CUT (RASP) ×2 IMPLANT
SOL PREP PVP 2OZ (MISCELLANEOUS) ×2
SOLUTION PREP PVP 2OZ (MISCELLANEOUS) ×1 IMPLANT
STOCKINETTE STRL 6IN 960660 (GAUZE/BANDAGES/DRESSINGS) ×4 IMPLANT
STRAP SAFETY 5IN WIDE (MISCELLANEOUS) ×2 IMPLANT
STRIP CLOSURE SKIN 1/4X4 (GAUZE/BANDAGES/DRESSINGS) IMPLANT
SUT ETHILON 5-0 FS-2 18 BLK (SUTURE) ×2 IMPLANT
SUT VIC AB 4-0 FS2 27 (SUTURE) ×4 IMPLANT
SYR 10ML LL (SYRINGE) ×2 IMPLANT
WIRE Z .035 C-WIRE SPADE TIP (WIRE) IMPLANT
WIRE Z .062 C-WIRE SPADE TIP (WIRE) IMPLANT

## 2018-02-01 NOTE — Transfer of Care (Signed)
Immediate Anesthesia Transfer of Care Note  Patient: Janet Keller  Procedure(s) Performed: HAMMER TOE CORRECTION/Bil 5TH toes (Bilateral Foot)  Patient Location: PACU  Anesthesia Type:General  Level of Consciousness: sedated  Airway & Oxygen Therapy: Patient Spontanous Breathing and Patient connected to face mask oxygen  Post-op Assessment: Report given to RN and Post -op Vital signs reviewed and stable  Post vital signs: Reviewed and stable  Last Vitals:  Vitals:   02/01/18 0613  BP: (!) 188/90  Pulse: 75  Resp: 18  Temp: 36.6 C  SpO2: 99%    Last Pain:  Vitals:   02/01/18 0613  TempSrc: Tympanic         Complications: No apparent anesthesia complications

## 2018-02-01 NOTE — Anesthesia Procedure Notes (Signed)
Procedure Name: LMA Insertion Date/Time: 02/01/2018 7:45 AM Performed by: Andria Frames, MD Pre-anesthesia Checklist: Patient identified, Emergency Drugs available, Suction available, Patient being monitored and Timeout performed Patient Re-evaluated:Patient Re-evaluated prior to induction Oxygen Delivery Method: Circle system utilized Preoxygenation: Pre-oxygenation with 100% oxygen Induction Type: IV induction Ventilation: Mask ventilation without difficulty LMA: LMA inserted LMA Size: 4.0 Number of attempts: 1 Placement Confirmation: positive ETCO2 Tube secured with: Tape Dental Injury: Teeth and Oropharynx as per pre-operative assessment

## 2018-02-01 NOTE — Anesthesia Postprocedure Evaluation (Signed)
Anesthesia Post Note  Patient: Janet Keller  Procedure(s) Performed: HAMMER TOE CORRECTION/Bil 5TH toes (Bilateral Foot)  Patient location during evaluation: PACU Anesthesia Type: General Level of consciousness: awake and alert Pain management: pain level controlled Vital Signs Assessment: post-procedure vital signs reviewed and stable Respiratory status: spontaneous breathing, nonlabored ventilation, respiratory function stable and patient connected to nasal cannula oxygen Cardiovascular status: blood pressure returned to baseline and stable Postop Assessment: no apparent nausea or vomiting Anesthetic complications: no     Last Vitals:  Vitals:   02/01/18 0951 02/01/18 1017  BP: (!) 151/72 (!) 146/64  Pulse: 62 68  Resp: 14   Temp: 36.6 C   SpO2: 97% 100%    Last Pain:  Vitals:   02/01/18 0935  TempSrc:   PainSc: 0-No pain                 Precious Haws Piscitello

## 2018-02-01 NOTE — Op Note (Signed)
Date of operation: 02/01/2018.  Surgeon: Durward Fortes D.P.M.  Preoperative diagnosis: Hammertoe with exostosis bilateral fifth toes.  Postoperative diagnosis: Same with gouty tophi.  Procedure: Arthroplasty bilateral fifth toes.  Anesthesia: LMA with local.  Hemostasis: Pneumatic tourniquet bilateral ankles 250 mmHg.  Estimated blood loss: Minimal.  Pathology: Bone and soft tissue bilateral fifth toes.  Complications: None apparent.  Operative indications: This is a 77 year old female with a chronic history of painful hammertoes on both of her fifth toes.  Patient has decided for surgical intervention to remove the bony prominences.  Operative procedure: Patient was taken to the operating room and placed on the table in the supine position.  Following satisfactory LMA anesthesia bilateral fifth toes were injected with a total of 10 cc 0.5% bupivacaine plain around the toe and just proximal.  Bilateral tourniquets were applied at the level of the ankles.  Both feet were then prepped and draped in the usual sterile fashion and the left foot was exsanguinated and the tourniquet inflated to 250 mmHg.     Attention was then directed to the dorsal aspect of the left fifth toe where an approximate 2 cm linear incision was made over the proximal interphalangeal joint.  The incision was deepened via sharp and blunt dissection down to the level of the joint where a transverse tenotomy was performed and the capsular and periosteal tissue reflected off of the head of the proximal phalanx.  Using a sagittal saw the head of the proximal phalanx was resected in [place].  The lateral aspect of the middle phalanx was freed from the surrounding soft tissue and the sagittal saw was used to remove the lateral most portion of the middle phalanx.  There was noted to be some gouty tophi collection in the soft tissues along the lateral aspect of the joint.  This was carefully excised using a Beaver blade and hemostats.   Intraoperative FluoroScan views revealed good reduction of the deformity and removal of the tophi area although there was one very small area of still present within the skin that could not be identified and safely removed.  The wound was then flushed with copious amounts of sterile saline and closed using 4-0 Vicryl simple suture for tendon reapproximation followed by skin closure using 5-0 nylon simple interrupted suture.  Xeroform 4 x 4's and a sterile gauze bandage was applied to the left lower extremity.  The tourniquet was released and blood flow noted return immediately to the left foot and all digits.     Attention was then directed to the right foot which was exsanguinated and the tourniquet inflated to 250 mmHg.  The identical procedure that was just described above on the left fifth toe was performed on the right fifth toe except for the fact that there was no more residual soft tissue density or gouty tophi noted remaining on intraoperative x-rays.  Xeroform and a sterile gauze bandage applied to the right foot and the tourniquet was released and blood flow noted return immediately to the right foot and all digits.  Patient was awakened and transported to the PACU with vital signs stable and in good condition.

## 2018-02-01 NOTE — H&P (Signed)
  Medical history and physical in the chart was reviewed.  Patient stable for surgery. 

## 2018-02-01 NOTE — Anesthesia Post-op Follow-up Note (Signed)
Anesthesia QCDR form completed.        

## 2018-02-01 NOTE — Interval H&P Note (Signed)
History and Physical Interval Note:  02/01/2018 7:13 AM  Janet Keller  has presented today for surgery, with the diagnosis of Hammertoes M20.41;M20.42;Exostosis M89.8X9  The various methods of treatment have been discussed with the patient and family. After consideration of risks, benefits and other options for treatment, the patient has consented to  Procedure(s): HAMMER TOE CORRECTION/Bil 5TH toes (Bilateral) as a surgical intervention .  The patient's history has been reviewed, patient examined, no change in status, stable for surgery.  I have reviewed the patient's chart and labs.  Questions were answered to the patient's satisfaction.     Durward Fortes

## 2018-02-01 NOTE — OR Nursing (Signed)
DISCUSSED DISCHARGE INSTRUCTIONS WITH PT AND SON. bOTH VOICE UNDERSTANDING.

## 2018-02-01 NOTE — Discharge Instructions (Addendum)
1.  Elevate both lower extremities on pillows.  2.  Keep the bandages on both feet clean, dry, and do not remove.  3.  Sponge bathe only both lower extremities.  4.  Wear surgical shoes on both feet whenever walking or standing.  5.  Take 1 pain pill, hydrocodone, every 4 hours if needed for pain.   AMBULATORY SURGERY  DISCHARGE INSTRUCTIONS   1) The drugs that you were given will stay in your system until tomorrow so for the next 24 hours you should not:  A) Drive an automobile B) Make any legal decisions C) Drink any alcoholic beverage   2) You may resume regular meals tomorrow.  Today it is better to start with liquids and gradually work up to solid foods.  You may eat anything you prefer, but it is better to start with liquids, then soup and crackers, and gradually work up to solid foods.   3) Please notify your doctor immediately if you have any unusual bleeding, trouble breathing, redness and pain at the surgery site, drainage, fever, or pain not relieved by medication.    4) Additional Instructions:        Please contact your physician with any problems or Same Day Surgery at [phone], Monday through Friday 6 am to 4 pm, or [place] at [facility] number at [phone].

## 2018-02-01 NOTE — Anesthesia Preprocedure Evaluation (Signed)
Anesthesia Evaluation  Patient identified by MRN, date of birth, ID band Patient awake    Reviewed: Allergy & Precautions, H&P , NPO status , Patient's Chart, lab work & pertinent test results  History of Anesthesia Complications Negative for: history of anesthetic complications  Airway Mallampati: III  TM Distance: >3 FB Neck ROM: limited    Dental  (+) Chipped, Poor Dentition, Missing, Partial Upper   Pulmonary neg shortness of breath, former smoker,           Cardiovascular Exercise Tolerance: Good hypertension, (-) angina(-) Past MI and (-) DOE      Neuro/Psych negative neurological ROS  negative psych ROS   GI/Hepatic Neg liver ROS, GERD  Medicated and Controlled,  Endo/Other  diabetes, Type 2  Renal/GU      Musculoskeletal  (+) Arthritis ,   Abdominal   Peds  Hematology negative hematology ROS (+)   Anesthesia Other Findings Past Medical History: No date: Arthritis     Comment:  osteoarthritis No date: Breast pain No date: Diabetes mellitus without complication (HCC) No date: GERD (gastroesophageal reflux disease) No date: HIV (human immunodeficiency virus infection) (HCC) No date: Hyperlipidemia No date: Hypertension 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[place])  Past Surgical History: 1985: BREAST BIOPSY; Left No date: CATARACT EXTRACTION W/ INTRAOCULAR LENS  IMPLANT, BILATERAL 02/18/2016: HYSTEROSCOPY W/D&C; N/A     Comment:  Procedure: Fractional DILATATION AND CURETTAGE               /HYSTEROSCOPY , Polypectomy and myomectomy;  Surgeon:               Benjaman Kindler, MD;  Location: [facility] ORS;  Service:               Gynecology;  Laterality: N/A; No date: TUBAL LIGATION  BMI    Body Mass Index:  25.40 kg/m      Reproductive/Obstetrics negative OB ROS                             Anesthesia Physical Anesthesia Plan  ASA: III  Anesthesia Plan: General LMA   Post-op Pain  Management:    Induction: Intravenous  PONV Risk Score and Plan: 2 and Ondansetron, Dexamethasone and Treatment may vary due to age or medical condition  Airway Management Planned: LMA  Additional Equipment:   Intra-op Plan:   Post-operative Plan: Extubation in OR  Informed Consent: I have reviewed the patients History and Physical, chart, labs and discussed the procedure including the risks, benefits and alternatives for the proposed anesthesia with the patient or authorized representative who has indicated his/her understanding and acceptance.   Dental Advisory Given  Plan Discussed with: Anesthesiologist, CRNA and Surgeon  Anesthesia Plan Comments: (Patient consented for risks of anesthesia including but not limited to:  - adverse reactions to medications - damage to teeth, lips or other oral mucosa - sore throat or hoarseness - Damage to heart, brain, lungs or loss of life  Patient voiced understanding.)        Anesthesia Quick Evaluation

## 2018-02-04 LAB — SURGICAL PATHOLOGY

## 2018-05-27 DEATH — deceased

## 2018-08-26 ENCOUNTER — Other Ambulatory Visit
Admission: RE | Admit: 2018-08-26 | Discharge: 2018-08-26 | Disposition: A | Discharge status: Home / Self Care | Payer: 59 | Source: Ambulatory Visit | Attending: Internal Medicine | Admitting: Internal Medicine

## 2018-08-26 DIAGNOSIS — Z029 Encounter for administrative examinations, unspecified: Secondary | ICD-10-CM | POA: Insufficient documentation

## 2018-08-26 LAB — CBC
HCT: 34.7 % — ABNORMAL LOW (ref 35.0–47.0)
Hemoglobin: 12.2 g/dL (ref 12.0–16.0)
MCH: 33.6 pg (ref 26.0–34.0)
MCHC: 35.1 g/dL (ref 32.0–36.0)
MCV: 95.6 fL (ref 80.0–100.0)
PLATELETS: 234 10*3/uL (ref 150–440)
RBC: 3.63 MIL/uL — ABNORMAL LOW (ref 3.80–5.20)
RDW: 14.7 % — AB (ref 11.5–14.5)
WBC: 7.2 10*3/uL (ref 3.6–11.0)

## 2018-08-26 LAB — BASIC METABOLIC PANEL
Anion gap: 8 (ref 5–15)
BUN: 16 mg/dL (ref 8–23)
CO2: 26 mmol/L (ref 22–32)
CREATININE: 1.09 mg/dL — AB (ref 0.44–1.00)
Calcium: 9.4 mg/dL (ref 8.9–10.3)
Chloride: 104 mmol/L (ref 98–111)
GFR calc Af Amer: 55 mL/min — ABNORMAL LOW (ref >60)
GFR, EST NON AFRICAN AMERICAN: 48 mL/min — AB (ref >60)
Glucose, Bld: 97 mg/dL (ref 70–99)
Potassium: 3.8 mmol/L (ref 3.5–5.1)
SODIUM: 138 mmol/L (ref 135–145)

## 2018-08-27 LAB — HEMOGLOBIN A1C
Hgb A1c MFr Bld: 6.2 % — ABNORMAL HIGH (ref 4.8–5.6)
MEAN PLASMA GLUCOSE: 131 mg/dL

## 2018-09-17 ENCOUNTER — Encounter: Payer: Self-pay | Admitting: Infectious Diseases

## 2018-09-17 ENCOUNTER — Ambulatory Visit: Payer: 59 | Attending: Infectious Diseases | Admitting: Infectious Diseases

## 2018-09-17 VITALS — BP 207/87 | HR 60 | Temp 98.0°F | Wt 143.8 lb

## 2018-09-17 DIAGNOSIS — Z87891 Personal history of nicotine dependence: Secondary | ICD-10-CM | POA: Diagnosis not present

## 2018-09-17 DIAGNOSIS — Z9889 Other specified postprocedural states: Secondary | ICD-10-CM | POA: Insufficient documentation

## 2018-09-17 DIAGNOSIS — Z8249 Family history of ischemic heart disease and other diseases of the circulatory system: Secondary | ICD-10-CM | POA: Diagnosis not present

## 2018-09-17 DIAGNOSIS — B20 Human immunodeficiency virus [HIV] disease: Secondary | ICD-10-CM | POA: Diagnosis not present

## 2018-09-17 DIAGNOSIS — Z79899 Other long term (current) drug therapy: Secondary | ICD-10-CM | POA: Diagnosis not present

## 2018-09-17 DIAGNOSIS — R413 Other amnesia: Secondary | ICD-10-CM

## 2018-09-17 DIAGNOSIS — Z9851 Tubal ligation status: Secondary | ICD-10-CM | POA: Insufficient documentation

## 2018-09-17 DIAGNOSIS — I10 Essential (primary) hypertension: Secondary | ICD-10-CM | POA: Insufficient documentation

## 2018-09-17 DIAGNOSIS — E785 Hyperlipidemia, unspecified: Secondary | ICD-10-CM | POA: Diagnosis not present

## 2018-09-17 DIAGNOSIS — E119 Type 2 diabetes mellitus without complications: Secondary | ICD-10-CM | POA: Insufficient documentation

## 2018-09-17 DIAGNOSIS — R450 Nervousness: Secondary | ICD-10-CM

## 2018-09-17 DIAGNOSIS — Z7984 Long term (current) use of oral hypoglycemic drugs: Secondary | ICD-10-CM | POA: Insufficient documentation

## 2018-09-17 DIAGNOSIS — Z972 Presence of dental prosthetic device (complete) (partial): Secondary | ICD-10-CM

## 2018-09-17 DIAGNOSIS — Z803 Family history of malignant neoplasm of breast: Secondary | ICD-10-CM | POA: Insufficient documentation

## 2018-09-17 DIAGNOSIS — Z833 Family history of diabetes mellitus: Secondary | ICD-10-CM | POA: Diagnosis not present

## 2018-09-17 DIAGNOSIS — Z9849 Cataract extraction status, unspecified eye: Secondary | ICD-10-CM

## 2018-09-17 DIAGNOSIS — R1030 Lower abdominal pain, unspecified: Secondary | ICD-10-CM

## 2018-09-17 MED ORDER — DOLUTEGRAVIR SODIUM 50 MG PO TABS: 50.0000 mg | ORAL_TABLET | Freq: Every day | ORAL | 1 refills | Status: DC

## 2018-09-17 MED ORDER — EMTRICITABINE-TENOFOVIR AF 200-25 MG PO TABS: 1.0000 | ORAL_TABLET | Freq: Every day | ORAL | 1 refills | Status: DC

## 2018-09-17 NOTE — Patient Instructions (Addendum)
You are here to establish care for HIV-  You say you have been having side effects from [facility] including abdominal discomfort, nervousness.  We will change to [facility] and Tivicay.  Stop Biktarvy With tivicay you have to check your blood sugar everyday and document it as you are on metformin and it may cause the sugar to go low. Do not take more than 2 pills of metformin 500mg  every day.   Take calcium and MVT 6 hs after or 2 hours before tivicay. Will see you in 1 month Before your next visit you need to do labs . Please go to the medical mall /lab to get the blood work

## 2018-09-17 NOTE — Progress Notes (Signed)
NAME: Janet Keller  DOB: January 29, 1941  MRN: [id number]  Date/Time: 09/17/2018 12:59 PM Subjective:  Here to establish HIV care Patient is a poor historian. Limited records reviewed.  PCP Dr. Edwina Barth  GYN ?Dr. Pixie Casino is a 77 y.o. with a history of HIV diagnosed in the early 84s.  She used to follow-up at [facility] and then with Dr. Clayborn Bigness and then Dr. Ola Spurr. She is currently on Biktarvy.  Last CD4 is 772 with 24.9 percentage and viral load of less than 20 from May 2019. She states Phillips Odor was started in January and before that she was on Isentress and Combivir.  The Phillips Odor has caused her stomach to feel bloated and also has given her nervousness. He did discuss this with Dr. Ola Spurr in May and there was a plan to change her medication. She states she acquired HIV through heterosexual transmission. Not sure when she was exactly diagnosed.  Not aware of her nadir CD4 count. Not aware of her prior regimens except Isentress and Combivir which she was on before changing to [facility] in January 2019. . Medical history Diabetes mellitus Hypertension Hyperlipidemia Fibroid uterus Malt lymphoma  Past surgical history Breast excisional biopsy on the left side  cataract extraction Tubal ligation Hammertoe surgery                    Social history Past smoker. Used to drink alcohol in the past Works at [employer] on her own Has 3 boys and one girl. One son passed away due to diabetes  Family History  Problem Relation Age of Onset  . Breast cancer Mother 31  Diabetes mellitus Brother Hypertension brother PE Father   No Known Allergies   Current medication Enalapril Glipizide Lovastatin which is Mevacor Metformin Metoprolol Prilosec Biktarvy Calcium M VT    REVIEW OF SYSTEMS:  Const: negative fever, negative chills, negative weight loss Eyes: negative diplopia or visual changes, negative eye pain ENT: negative coryza, negative sore  throat Resp: negative cough, hemoptysis, dyspnea Cards: negative for chest pain, palpitations, lower extremity edema GU: negative for frequency, dysuria and hematuria GI: Abdominal discomfort, distention, lower abdominal pain Skin: negative for rash and pruritus Heme: negative for easy bruising and gum/nose bleeding MS: negative for myalgias, arthralgias, back pain and muscle weakness Neurolo:negative for headaches, dizziness, vertigo, has memory problems  Psych: Anxiety nervousness  Objective:  VITALS:  BP (!) 207/87 (BP Location: Left Arm, Patient Position: Sitting, Cuff Size: Normal)   Pulse 60   Temp 98 F (36.7 C) (Oral)   Wt 143 lb 12.8 oz (65.2 kg)   BMI 24.68 kg/m  PHYSICAL EXAM:  General: Alert, cooperative, no distress, appears stated age.  Slow to respond to questions, unable to recollect Head: Normocephalic, without obvious abnormality, atraumatic. Eyes: Conjunctivae clear, anicteric sclerae. Pupils are equal Nose: Nares normal. No drainage or sinus tenderness. Throat: Lips, mucosa, and tongue normal. No Thrush, dentures Neck: Supple, symmetrical, no adenopathy, thyroid: non tender no carotid bruit and no JVD. Back: No CVA tenderness. Lungs: Clear to auscultation bilaterally. No Wheezing or Rhonchi. No rales. Heart: Regular rate and rhythm, no murmur, rub or gallop. Abdomen: Soft, non-tender,not distended. Bowel sounds normal. No masses Extremities: Extremities normal, atraumatic, no cyanosis. No edema. No clubbing Skin: No rashes or lesions. Not Jaundiced Lymph: Cervical, supraclavicular normal. Neurologic: Grossly non-focal Pertinent Lab CMP from 08/26/2018 Potassium 3.8 Plasma glucose 131 Creatinine 1.09 HbA1c 6.2  WBC 7.2 Hb 12.2 PLT 234  IMAGING RESULTS: Health maintenance Vaccination pneumovac- 23 on 02/08/2011 Prevnar-13 on 02/06/2017 HepB HepA TdaP on 05/10/2012 Flu Herpes zoster vaccination  RPR HEPC ab on 08/01/2016 was  negative GC/CHl Cervical Pap Lipid CMv TOXO -IGG/IgM HIV VL Cd4 quantiferon Gold Genotype HLA B5 701 HIV antibodies   Preventive screen Mammogram Dental examination Eye examination Colonoscopy   Impression/Recommendation ?77 year old female with history of HIV, hypertension, diabetes mellitus is here to establish care for HIV as her prior provider Dr. Ola Spurr is in [place] now.  HIV/AIDS: Well-controlled now with CD4 count of 772 and viral load of less than 20 from May 2019.  Currently on Biktarvy. Has side effects from [place].  Has abdominal distention and discomfort and also nervousness.  Her prior regimen was Isentress and Combivir. We will change to Descovy( TAf+FTC) and tivicay (Dolutegravir)  Drug interactions: With dolutegravir and metformin need to watch out her blood glucose and metformin cannot exceed beyond thousand milligrams a day.  May need to decrease the current dose of 500 twice daily if needed.  Asked the patient to to check her blood glucose every day. As she is also on calcium carbonate it will have to be taken either 6 hours later or 2 hours before dolutegravir.  Diabetes mellitus on metformin and glipizide.  Hemoglobin A1c 6.2 watch closely as she is on tolerate to give her an metformin dose need to be reduced if needed.  Hypertension currently on enalapril and metoprolol.  Hyperlipidemia on lovastatin.  She will need to have lipid screen  Health maintenance has to be updated.  ? She will have labs done in 3 weeks and see me in 4 weeks ___________________________________________________ Discussed with patient in great detail

## 2018-09-30 ENCOUNTER — Other Ambulatory Visit
Admission: RE | Admit: 2018-09-30 | Discharge: 2018-09-30 | Disposition: A | Discharge status: Home / Self Care | Payer: 59 | Source: Ambulatory Visit | Attending: Infectious Diseases | Admitting: Infectious Diseases

## 2018-09-30 DIAGNOSIS — B20 Human immunodeficiency virus [HIV] disease: Secondary | ICD-10-CM | POA: Diagnosis not present

## 2018-09-30 LAB — COMPREHENSIVE METABOLIC PANEL
ALBUMIN: 4.3 g/dL (ref 3.5–5.0)
ALT: 18 U/L (ref 0–44)
ANION GAP: 10 (ref 5–15)
AST: 26 U/L (ref 15–41)
Alkaline Phosphatase: 60 U/L (ref 38–126)
BUN: 23 mg/dL (ref 8–23)
CALCIUM: 9.8 mg/dL (ref 8.9–10.3)
CHLORIDE: 99 mmol/L (ref 98–111)
CO2: 27 mmol/L (ref 22–32)
Creatinine, Ser: 1.09 mg/dL — ABNORMAL HIGH (ref 0.44–1.00)
GFR calc non Af Amer: 48 mL/min — ABNORMAL LOW (ref >60)
GFR, EST AFRICAN AMERICAN: 55 mL/min — AB (ref >60)
GLUCOSE: 96 mg/dL (ref 70–99)
Potassium: 3.4 mmol/L — ABNORMAL LOW (ref 3.5–5.1)
SODIUM: 136 mmol/L (ref 135–145)
Total Bilirubin: 0.9 mg/dL (ref 0.3–1.2)
Total Protein: 8 g/dL (ref 6.5–8.1)

## 2018-09-30 LAB — CBC WITH DIFFERENTIAL/PLATELET
Abs Immature Granulocytes: 0.02 10*3/uL (ref 0.00–0.07)
BASOS ABS: 0 10*3/uL (ref 0.0–0.1)
Basophils Relative: 1 %
EOS PCT: 1 %
Eosinophils Absolute: 0.1 10*3/uL (ref 0.0–0.5)
HEMATOCRIT: 38.2 % (ref 36.0–46.0)
HEMOGLOBIN: 13 g/dL (ref 12.0–15.0)
Immature Granulocytes: 0 %
LYMPHS ABS: 3.2 10*3/uL (ref 0.7–4.0)
LYMPHS PCT: 49 %
MCH: 31.9 pg (ref 26.0–34.0)
MCHC: 34 g/dL (ref 30.0–36.0)
MCV: 93.9 fL (ref 80.0–100.0)
MONO ABS: 0.4 10*3/uL (ref 0.1–1.0)
Monocytes Relative: 7 %
NEUTROS ABS: 2.7 10*3/uL (ref 1.7–7.7)
NRBC: 0 % (ref 0.0–0.2)
Neutrophils Relative %: 42 %
Platelets: 263 10*3/uL (ref 150–400)
RBC: 4.07 MIL/uL (ref 3.87–5.11)
RDW: 14 % (ref 11.5–15.5)
WBC: 6.5 10*3/uL (ref 4.0–10.5)

## 2018-10-01 LAB — CD4/CD8 (T-HELPER/T-SUPPRESSOR CELL)
CD4 absolute: 850 /uL (ref 500–1900)
CD4%: 25 % — ABNORMAL LOW (ref 30.0–60.0)
CD8 T CELL ABS: 1740 /uL — AB (ref 230–1000)
CD8tox: 51 % — ABNORMAL HIGH (ref 15.0–40.0)
Ratio: 0.49 — ABNORMAL LOW (ref 1.0–3.0)
TOTAL LYMPHOCYTE COUNT: 3380 /uL (ref 1000–4000)

## 2018-10-01 LAB — RPR: RPR: NONREACTIVE

## 2018-10-01 LAB — HIV-1 RNA QUANT-NO REFLEX-BLD: LOG10 HIV-1 RNA: UNDETERMINED log10copy/mL

## 2018-10-03 LAB — QUANTIFERON-TB GOLD PLUS (RQFGPL)
QuantiFERON Mitogen Value: >10 IU/mL
QuantiFERON Nil Value: 0.45 IU/mL
QuantiFERON TB1 Ag Value: 0.28 IU/mL
QuantiFERON TB2 Ag Value: 0.21 IU/mL

## 2018-10-03 LAB — QUANTIFERON-TB GOLD PLUS: QUANTIFERON-TB GOLD PLUS: NEGATIVE

## 2018-10-08 ENCOUNTER — Ambulatory Visit: Payer: 59 | Attending: Infectious Diseases | Admitting: Infectious Diseases

## 2018-10-08 ENCOUNTER — Encounter: Payer: Self-pay | Admitting: Infectious Diseases

## 2018-10-08 ENCOUNTER — Other Ambulatory Visit: Payer: Self-pay | Admitting: Internal Medicine

## 2018-10-08 VITALS — BP 204/94 | HR 60 | Temp 97.9°F | Wt 142.0 lb

## 2018-10-08 DIAGNOSIS — E785 Hyperlipidemia, unspecified: Secondary | ICD-10-CM

## 2018-10-08 DIAGNOSIS — Z1231 Encounter for screening mammogram for malignant neoplasm of breast: Secondary | ICD-10-CM

## 2018-10-08 DIAGNOSIS — Z87891 Personal history of nicotine dependence: Secondary | ICD-10-CM

## 2018-10-08 DIAGNOSIS — Z79899 Other long term (current) drug therapy: Secondary | ICD-10-CM

## 2018-10-08 DIAGNOSIS — I10 Essential (primary) hypertension: Secondary | ICD-10-CM

## 2018-10-08 DIAGNOSIS — D259 Leiomyoma of uterus, unspecified: Secondary | ICD-10-CM

## 2018-10-08 DIAGNOSIS — E119 Type 2 diabetes mellitus without complications: Secondary | ICD-10-CM | POA: Diagnosis not present

## 2018-10-08 DIAGNOSIS — Z9851 Tubal ligation status: Secondary | ICD-10-CM

## 2018-10-08 DIAGNOSIS — B20 Human immunodeficiency virus [HIV] disease: Secondary | ICD-10-CM | POA: Diagnosis not present

## 2018-10-08 DIAGNOSIS — Z972 Presence of dental prosthetic device (complete) (partial): Secondary | ICD-10-CM

## 2018-10-08 DIAGNOSIS — Z7984 Long term (current) use of oral hypoglycemic drugs: Secondary | ICD-10-CM

## 2018-10-08 DIAGNOSIS — C884 Extranodal marginal zone B-cell lymphoma of mucosa-associated lymphoid tissue [MALT-lymphoma]: Secondary | ICD-10-CM

## 2018-10-08 DIAGNOSIS — Z9849 Cataract extraction status, unspecified eye: Secondary | ICD-10-CM

## 2018-10-08 NOTE — Progress Notes (Signed)
NAME: Janet Keller  DOB: December 23, 1940  MRN: [id number]  Date/Time: 10/08/2018 12:41 PM Subjective:  2nd visit Here to follow up on lab results PCP Dr. Edwina Barth  GYN ?Dr. Pixie Casino is a 77 y.o. with a history of HIV diagnosed in the early 54s.  She used to follow-up at [facility] and then with Dr. Clayborn Bigness and then Dr. Ola Spurr. She was on Biktarvy.which was changed to descovy + tivicay on her last visit with me on 09/17/18  because of nervousness, stomach bloating  she is doing well now Last labs show Vl < 20 and Cd4 > 800 from 09/30/18.   She states she acquired HIV through heterosexual transmission. Not sure when she was exactly diagnosed.  Not aware of her nadir CD4 count. Not aware of her prior regimens except Isentress and Combivir which she was on before changing to [facility] in January 2019. . Medical history Diabetes mellitus Hypertension Hyperlipidemia Fibroid uterus Malt lymphoma  Past surgical history Breast excisional biopsy on the left side  cataract extraction Tubal ligation Hammertoe surgery                    Social history Past smoker. Used to drink alcohol in the past Works at [employer] on her own Has 3 boys and one girl. One son passed away due to diabetes  Family History  Problem Relation Age of Onset  . Breast cancer Mother 70  Diabetes mellitus Brother Hypertension brother PE Father   No Known Allergies   Current medication Enalapril Glipizide Lovastatin which is Mevacor Metformin Metoprolol Prilosec Descovy+tivicay Calcium M VT     Objective:  VITALS:  BP (!) 204/94 (BP Location: Left Arm, Patient Position: Sitting, Cuff Size: Normal)   Pulse 60   Temp 97.9 F (36.6 C) (Oral)   Wt 142 lb (64.4 kg)   BMI 24.37 kg/m  PHYSICAL EXAM:  General: Alert, cooperative, no distress, appears stated age.  Slow to respond to questions, unable to recollect Head: Normocephalic, without obvious abnormality,  atraumatic. Eyes: Conjunctivae clear, anicteric sclerae. Pupils are equal Nose: Nares normal. No drainage or sinus tenderness. Throat: Lips, mucosa, and tongue normal. No Thrush, dentures Neck: Supple, symmetrical, no adenopathy, thyroid: non tender no carotid bruit and no JVD. Back: No CVA tenderness. Lungs: Clear to auscultation bilaterally. No Wheezing or Rhonchi. No rales. Heart: Regular rate and rhythm, no murmur, rub or gallop. Abdomen: Soft, non-tender,not distended. Bowel sounds normal. No masses Extremities: Extremities normal, atraumatic, no cyanosis. No edema. No clubbing Skin: No rashes or lesions. Not Jaundiced Lymph: Cervical, supraclavicular normal. Neurologic: Grossly non-focal Pertinent Lab CMP from 08/26/2018 Potassium 3.8 Plasma glucose 131 Creatinine 1.09 HbA1c 6.2  WBC 7.2 Hb 12.2 PLT 234    IMAGING RESULTS: Health maintenance Vaccination pneumovac- 23 on 02/08/2011 Prevnar-13 on 02/06/2017 HepB HepA TdaP on 05/10/2012 Flu Herpes zoster vaccination  RPR-NR 09/30/18 HEPC ab on 08/01/2016 was negative GC/CHl Cervical Pap Lipid CMv TOXO -IGG/IgM HIV VL Cd4 quantiferon Gold-09/30/18 -Negative Genotype HLA B5 701 HIV antibodies   Preventive screen Mammogram Dental examination Eye examination Colonoscopy   Impression/Recommendation ?77 year old female with history of HIV, hypertension, diabetes mellitus is here to establish care for HIV as her prior provider Dr. Ola Spurr is in [place] now.  HIV/AIDS: Well-controlled now with CD4 count of 850 (25%) and viral load of less than 20 from 09/30/18   Currently on Tivicay+ descovy and doing well- no bloating or nervousness- LFT/Cr okay   Drug interactions: With  dolutegravir and metformin need to watch out her blood glucose and metformin cannot exceed beyond thousand milligrams a day.  May need to decrease the current dose of 500 twice daily if needed.  Asked the patient to to check her blood glucose  every day. As she is also on calcium carbonate it will have to be taken either 6 hours later or 2 hours before dolutegravir.  Diabetes mellitus on metformin and glipizide.  Hemoglobin A1c 6.2 watch closely as she is on tolerate to give her an metformin dose need to be reduced if needed.  Hypertension currently on enalapril and metoprolol.  Hyperlipidemia on lovastatin. Check with PCP for lipid screen  Health maintenance has to be updated.-will need to get records before 2015 from [facility] clinic and also vaccination status Follow up 4 months  ?

## 2018-10-08 NOTE — Patient Instructions (Signed)
You are here to follow up on your labs after switching meds. Your cd4 is 850 and viral load is undetectable. You say the nervousness nd bloating feeling of your abdomen is much better. If you continue to have some discomfort under your rib on your left side you should discuss with your PCP and may be he can look into getting some imaging. Need to check your cholesterol

## 2018-10-10 ENCOUNTER — Other Ambulatory Visit: Payer: Self-pay | Admitting: Licensed Clinical Social Worker

## 2018-10-10 MED ORDER — DOLUTEGRAVIR SODIUM 50 MG PO TABS: 50.0000 mg | ORAL_TABLET | Freq: Every day | ORAL | 3 refills | Status: DC

## 2018-11-08 ENCOUNTER — Other Ambulatory Visit: Payer: Self-pay | Admitting: Licensed Clinical Social Worker

## 2018-11-08 MED ORDER — EMTRICITABINE-TENOFOVIR AF 200-25 MG PO TABS: 1.0000 | ORAL_TABLET | Freq: Every day | ORAL | 3 refills | Status: DC

## 2018-11-19 ENCOUNTER — Other Ambulatory Visit: Payer: Self-pay | Admitting: Gastroenterology

## 2018-11-19 DIAGNOSIS — R1031 Right lower quadrant pain: Secondary | ICD-10-CM

## 2018-11-21 ENCOUNTER — Telehealth: Payer: Self-pay | Admitting: Licensed Clinical Social Worker

## 2018-11-21 NOTE — Telephone Encounter (Signed)
Patient was unable to get full prescription for Descovy and Tivicay due to [facility] claim exceeded limit on medications. I called [employer] at [phone] and customer service rep explained that no prior authorization was needed. Pharmacist at [facility] needed to call [phone] and request a claim dollar override and this will allow the patient to keep getting her medications every 30 days. Patient currently has 7 days left for medication for both. I will call the patient to notify her of the change and to follow up with the pharmacy on when to pick her medications up.

## 2018-12-02 ENCOUNTER — Ambulatory Visit
Admission: RE | Admit: 2018-12-02 | Discharge: 2018-12-02 | Disposition: A | Discharge status: Home / Self Care | Payer: 59 | Source: Ambulatory Visit | Attending: Internal Medicine | Admitting: Internal Medicine

## 2018-12-02 DIAGNOSIS — Z1231 Encounter for screening mammogram for malignant neoplasm of breast: Secondary | ICD-10-CM | POA: Insufficient documentation

## 2018-12-04 ENCOUNTER — Ambulatory Visit
Admission: RE | Admit: 2018-12-04 | Discharge: 2018-12-04 | Disposition: A | Discharge status: Home / Self Care | Payer: 59 | Source: Ambulatory Visit | Attending: Gastroenterology | Admitting: Gastroenterology

## 2018-12-04 DIAGNOSIS — R1031 Right lower quadrant pain: Secondary | ICD-10-CM | POA: Insufficient documentation

## 2018-12-04 LAB — POCT I-STAT CREATININE: CREATININE: 1.2 mg/dL — AB (ref 0.44–1.00)

## 2018-12-04 MED ORDER — IOHEXOL 300 MG/ML  SOLN
100.0000 mL | Freq: Once | INTRAMUSCULAR | Status: AC | PRN
  Administered 2018-12-04: 100 mL via INTRAVENOUS

## 2018-12-19 ENCOUNTER — Telehealth: Payer: Self-pay | Admitting: Infectious Diseases

## 2018-12-19 NOTE — Telephone Encounter (Signed)
Melina Schools called in reference to the savings card troubleshoot.  He gave multiple #'s to contact if no answer on that line  [phone] or [phone]

## 2018-12-20 ENCOUNTER — Telehealth: Payer: Self-pay | Admitting: Licensed Clinical Social Worker

## 2018-12-20 NOTE — Telephone Encounter (Signed)
Patient called stating that her copay for descovy and tivicay was 135 each. I called her insurance to verify and that was correct. She has [employer] for medical benefits. I activated copay card through [facility] for [place] and it will cover her copay in full for the year. I activated copay card through [facility] for tivicay, but received a denial because it is detecting her government insurance which does not cover rx. After speaking with several representatives from [place] called [employer] which is the manufacture of the copay assistance cards. I was advised that the patients pharmacy needs to call [phone] to request an override and they will cover the copay in full for the rest of the year. I called [facility] and spoke with Abigail Butts and she states she would call for the patient.  I called the patient, but she did not answer and I did not get an option for voicemail.

## 2018-12-23 NOTE — Telephone Encounter (Signed)
I called the pharmacy and the pharmacist stated that they were on hold with [facility] for over an hour and attempted to call back and was on hold for 45 minutes. Patient still does not have Tivicay and I have called numerous times and talked to several representatives including sales and can not get the copay card to go through. The [employer] card works and will pay for any [employer] drug prescribed. [employer] card will not cover right now without an authorization. [facility] is trying to give the authorization but can not speak with anyone. Please advise

## 2018-12-23 NOTE — Telephone Encounter (Signed)
Until we are able to figure this out or get a ne regimen you can ask her to start taking Biktarvy if she has any pills remaining.thx

## 2018-12-23 NOTE — Telephone Encounter (Signed)
Called and left message for patient about medication option.

## 2018-12-23 NOTE — Telephone Encounter (Signed)
Okay-THX

## 2018-12-24 ENCOUNTER — Ambulatory Visit: Payer: 59

## 2019-01-14 ENCOUNTER — Other Ambulatory Visit: Payer: Self-pay | Admitting: Licensed Clinical Social Worker

## 2019-01-14 DIAGNOSIS — B20 Human immunodeficiency virus [HIV] disease: Secondary | ICD-10-CM

## 2019-01-14 MED ORDER — DOLUTEGRAVIR SODIUM 50 MG PO TABS: 50.0000 mg | ORAL_TABLET | Freq: Every day | ORAL | 3 refills | Status: DC

## 2019-01-28 ENCOUNTER — Ambulatory Visit: Payer: 59 | Admitting: Infectious Diseases

## 2019-02-04 ENCOUNTER — Ambulatory Visit: Payer: Medicare Other | Attending: Infectious Diseases | Admitting: Infectious Diseases

## 2019-02-04 ENCOUNTER — Encounter: Payer: Self-pay | Admitting: Infectious Diseases

## 2019-02-04 VITALS — BP 167/81 | HR 95 | Temp 97.8°F | Ht 64.0 in | Wt 146.0 lb

## 2019-02-04 DIAGNOSIS — E785 Hyperlipidemia, unspecified: Secondary | ICD-10-CM

## 2019-02-04 DIAGNOSIS — Z79899 Other long term (current) drug therapy: Secondary | ICD-10-CM

## 2019-02-04 DIAGNOSIS — Z87891 Personal history of nicotine dependence: Secondary | ICD-10-CM

## 2019-02-04 DIAGNOSIS — Z7984 Long term (current) use of oral hypoglycemic drugs: Secondary | ICD-10-CM

## 2019-02-04 DIAGNOSIS — Z21 Asymptomatic human immunodeficiency virus [HIV] infection status: Secondary | ICD-10-CM

## 2019-02-04 DIAGNOSIS — E119 Type 2 diabetes mellitus without complications: Secondary | ICD-10-CM

## 2019-02-04 DIAGNOSIS — I10 Essential (primary) hypertension: Secondary | ICD-10-CM

## 2019-02-04 DIAGNOSIS — B20 Human immunodeficiency virus [HIV] disease: Secondary | ICD-10-CM

## 2019-02-04 NOTE — Progress Notes (Signed)
NAME: Janet Keller  DOB: 06/11/1941  MRN: [id number]  Date/Time: 02/04/2019 12:30 PM   Subjective:  Follow up Last visit in nov 2019 Saw her PCP after that ? LASHAWNTA BURGERT is a 78 y.o. with a history of HIV diagnosed in the early 53s. Currently on Descovy and Tivicay 100% adherent   Doing okay No specific issues Tolerating meds  Went to [place] in [place] to  See her daughter. Was there for 5 days    HIV history She states she acquired HIV through heterosexual transmission. Not sure when she was exactly diagnosed.  Not aware of her nadir CD4 count. Not aware of her prior regimens except Isentress and Combivir which she was on before changing to [facility] in January 2019. She used to follow-up at [facility] and then with Dr. Clayborn Bigness and then Dr. Ola Spurr.She was on Biktarvy.which was changed to descovy + tivicay on her last visit with me on 09/17/18  because of nervousness, stomach bloating  she is doing well now Last labs show Vl < 20 and Cd4 > 800 from 09/30/18.   Medical history Diabetes mellitus Hypertension Hyperlipidemia Fibroid uterus Malt lymphoma  Past surgical history Breast excisional biopsy on the left side  cataract extraction Tubal ligation Hammertoe surgery                    Social history Past smoker. Used to drink alcohol in the past Works at [employer] on her own Has 3 boys and one girl. One son passed away due to diabetes       Family History  Problem Relation Age of Onset  . Breast cancer Mother 43   No Known Allergies ? Pt asked to bring all her meds as there is duplication. Current Outpatient Medications  Medication Sig Dispense Refill  . acetaminophen (TYLENOL) 500 MG tablet Take 500 mg by mouth every 6 (six) hours as needed.    . Calcium Carbonate (CALCIUM-CARB 600 PO) Take 2 tablets by mouth daily.    . diphenhydrAMINE (BENADRYL) 25 MG tablet Take 25 mg by mouth daily as needed for allergies.    Marland Kitchen dolutegravir  (TIVICAY) 50 MG tablet Take 1 tablet (50 mg total) by mouth daily. 30 tablet 3  . emtricitabine-tenofovir AF (DESCOVY) 200-25 MG tablet Take 1 tablet by mouth daily. 30 tablet 3  . enalapril (VASOTEC) 10 MG tablet Take 10 mg by mouth 2 (two) times daily.  3  . estradiol (ESTRACE VAGINAL) 0.1 MG/GM vaginal cream Place 0.5 application vaginally once a week.    Marland Kitchen glipiZIDE (GLUCOTROL) 5 MG tablet Take 5 mg by mouth daily before breakfast.    . hydrochlorothiazide (HYDRODIURIL) 12.5 MG tablet Take 1 tablet by mouth daily.    Marland Kitchen lovastatin (MEVACOR) 40 MG tablet Take 40 mg by mouth at bedtime.     . metFORMIN (GLUCOPHAGE) 500 MG tablet Take 500 mg by mouth 2 (two) times daily with a meal.    . metoprolol tartrate (LOPRESSOR) 25 MG tablet Take 25 mg by mouth 2 (two) times daily.     . Multiple Vitamins-Minerals (MULTIVITAMIN ADULT PO) Take 1 tablet by mouth daily.    Marland Kitchen nystatin ointment (MYCOSTATIN) Apply 1 application topically 2 (two) times daily as needed (skin irritation).     Marland Kitchen omeprazole (PRILOSEC) 40 MG capsule Take 40 mg by mouth daily.    Vladimir Faster Glyc-Propyl Glyc PF (SYSTANE ULTRA PF) 0.4-0.3 % SOLN Place 1 drop into both eyes as needed (  dry eyes).    . metoprolol succinate (TOPROL-XL) 50 MG 24 hr tablet Take 50 mg by mouth daily.      No current facility-administered medications for this visit.     REVIEW OF SYSTEMS:  Const: negative fever, negative chills, negative weight loss Eyes: negative diplopia or visual changes, negative eye pain ENT: negative coryza, negative sore throat Resp: negative cough, hemoptysis, dyspnea Cards: negative for chest pain, palpitations, lower extremity edema GU: negative for frequency, dysuria and hematuria Skin: negative for rash and pruritus Heme: negative for easy bruising and gum/nose bleeding MS: negative for myalgias, arthralgias, back pain and muscle weakness Neurolo:negative for headaches, dizziness, vertigo, memory problems  Psych: negative  for feelings of anxiety, depression  Pertinent Positives include : Objective:  VITALS:  BP (!) 167/81 (BP Location: Left Arm, Patient Position: Sitting, Cuff Size: Normal)   Pulse 95   Temp 97.8 F (36.6 C) (Oral)   Ht 5\' 4"  (1.626 m)   Wt 146 lb (66.2 kg)   BMI 25.06 kg/m  PHYSICAL EXAM:  General: Alert, cooperative, no distress, appears stated age.  Head: Normocephalic, without obvious abnormality, atraumatic. Eyes: Conjunctivae clear, anicteric sclerae. Pupils are equal Nose: Nares normal. No drainage or sinus tenderness. Throat: Lips, mucosa, and tongue normal. No Thrush Neck: Supple, symmetrical, no adenopathy, thyroid: non tender no carotid bruit and no JVD. Back: No CVA tenderness. Lungs: Clear to auscultation bilaterally. No Wheezing or Rhonchi. No rales. Heart: Regular rate and rhythm, no murmur, rub or gallop. Abdomen: Soft, non-tender,not distended. Bowel sounds normal. No masses Extremities: Extremities normal, atraumatic, no cyanosis. No edema. No clubbing Skin: No rashes or lesions. Not Jaundiced Lymph: Cervical, supraclavicular normal. Neurologic: Grossly non-focal Health maintenance Vaccination  Vaccine Date last given comment  Influenza Sept 2019 Given at work  Hepatitis B    Hepatitis A    Prevnar-PCV-13 02/06/2017   Pneumovac-PPSV-23 02/08/2011   TdaP 05/10/2012   HPV    Shingrix ( zoster vaccine)     ______________________  Labs Lab Result  Date comment  HIV VL <20 09/30/18   CD4 850(25%) 09/30/18   Genotype     [id number]     HIV antibody     RPR NR 09/30/18   Quantiferon Gold Neg 09/30/18   Hep C ab NEg 08/01/16   Hepatitis B-ab,ag,c     Hepatitis A-IgM, IgG /T     Lipid(TC,HDL,LDL,TG) 168/39/92/183 01/20/19   GC/CHL     PAP     Creatinine  1.1 2/24     Preventive  Procedure Result  Date comment  colonoscopy     Mammogram Normal 12/02/18 Dense breast  tisue  Dental exam cleaning Feb 2020   Opthal        Impression/Recommendation ? ?78 year old female with history of HIV, hypertension, diabetes mellitus   HIV/AIDS: Well-controlled now with CD4 count of 850and viral load of less than 20 from Nov 2019.  Currently on Descovy/Tivicay.  Drug interactions: With dolutegravir and metformin need to watch out her blood glucose and metformin cannot exceed beyond thousand milligrams a day.  she is currently on 500mg  BID As she is also on calcium carbonate it will have to be taken either 6 hours later or 2 hours before dolutegravir.  Diabetes mellitus on metformin 500mg  BID  and glipizide 5mg  .     Hypertension currently on enalapril and metoprolol.hydralazine, HCTZ- followed by Dr.Johnston  Hyperlipidemia on lovastatin. lipids checked in Feb -good   Health maintenance has been updated  ?  ?  _Discussed the management with the patient. Labs 2 weeks before her next appt follow up 4 months

## 2019-03-11 ENCOUNTER — Other Ambulatory Visit: Payer: Self-pay | Admitting: Licensed Clinical Social Worker

## 2019-03-11 DIAGNOSIS — B20 Human immunodeficiency virus [HIV] disease: Secondary | ICD-10-CM

## 2019-03-11 MED ORDER — EMTRICITABINE-TENOFOVIR AF 200-25 MG PO TABS: 1.0000 | ORAL_TABLET | Freq: Every day | ORAL | 3 refills | Status: DC

## 2019-03-11 MED ORDER — DOLUTEGRAVIR SODIUM 50 MG PO TABS: 50.0000 mg | ORAL_TABLET | Freq: Every day | ORAL | 3 refills | Status: DC

## 2019-06-30 ENCOUNTER — Other Ambulatory Visit: Payer: Self-pay | Admitting: Licensed Clinical Social Worker

## 2019-06-30 DIAGNOSIS — B20 Human immunodeficiency virus [HIV] disease: Secondary | ICD-10-CM

## 2019-06-30 MED ORDER — TIVICAY 50 MG PO TABS: 50.0000 mg | ORAL_TABLET | Freq: Every day | ORAL | 0 refills | Status: DC

## 2019-06-30 MED ORDER — EMTRICITABINE-TENOFOVIR AF 200-25 MG PO TABS: 1.0000 | ORAL_TABLET | Freq: Every day | ORAL | 0 refills | Status: DC

## 2019-06-30 NOTE — Telephone Encounter (Signed)
I called patient and left a message for her to call me and schedule her follow up visit.

## 2019-07-31 ENCOUNTER — Ambulatory Visit: Payer: Medicare Other | Attending: Infectious Diseases | Admitting: Infectious Diseases

## 2019-07-31 ENCOUNTER — Encounter: Payer: Self-pay | Admitting: Infectious Diseases

## 2019-07-31 ENCOUNTER — Other Ambulatory Visit: Payer: Self-pay

## 2019-07-31 ENCOUNTER — Other Ambulatory Visit
Admission: RE | Admit: 2019-07-31 | Discharge: 2019-07-31 | Disposition: A | Discharge status: Home / Self Care | Payer: 59 | Source: Ambulatory Visit | Attending: Infectious Diseases | Admitting: Infectious Diseases

## 2019-07-31 VITALS — BP 151/75 | HR 68 | Temp 98.7°F | Wt 144.4 lb

## 2019-07-31 DIAGNOSIS — Z87891 Personal history of nicotine dependence: Secondary | ICD-10-CM

## 2019-07-31 DIAGNOSIS — E1142 Type 2 diabetes mellitus with diabetic polyneuropathy: Secondary | ICD-10-CM

## 2019-07-31 DIAGNOSIS — E785 Hyperlipidemia, unspecified: Secondary | ICD-10-CM | POA: Diagnosis not present

## 2019-07-31 DIAGNOSIS — Z7982 Long term (current) use of aspirin: Secondary | ICD-10-CM

## 2019-07-31 DIAGNOSIS — B20 Human immunodeficiency virus [HIV] disease: Secondary | ICD-10-CM | POA: Insufficient documentation

## 2019-07-31 DIAGNOSIS — Z7984 Long term (current) use of oral hypoglycemic drugs: Secondary | ICD-10-CM

## 2019-07-31 DIAGNOSIS — I10 Essential (primary) hypertension: Secondary | ICD-10-CM | POA: Diagnosis not present

## 2019-07-31 DIAGNOSIS — Z79899 Other long term (current) drug therapy: Secondary | ICD-10-CM

## 2019-07-31 LAB — COMPREHENSIVE METABOLIC PANEL
ALT: 24 U/L (ref 0–44)
AST: 27 U/L (ref 15–41)
Albumin: 4.1 g/dL (ref 3.5–5.0)
Alkaline Phosphatase: 53 U/L (ref 38–126)
Anion gap: 14 (ref 5–15)
BUN: 24 mg/dL — ABNORMAL HIGH (ref 8–23)
CO2: 25 mmol/L (ref 22–32)
Calcium: 9.7 mg/dL (ref 8.9–10.3)
Chloride: 100 mmol/L (ref 98–111)
Creatinine, Ser: 1.32 mg/dL — ABNORMAL HIGH (ref 0.44–1.00)
GFR calc Af Amer: 45 mL/min — ABNORMAL LOW (ref >60)
GFR calc non Af Amer: 39 mL/min — ABNORMAL LOW (ref >60)
Glucose, Bld: 75 mg/dL (ref 70–99)
Potassium: 4.1 mmol/L (ref 3.5–5.1)
Sodium: 139 mmol/L (ref 135–145)
Total Bilirubin: 0.7 mg/dL (ref 0.3–1.2)
Total Protein: 7.4 g/dL (ref 6.5–8.1)

## 2019-07-31 MED ORDER — TIVICAY 50 MG PO TABS: 50.0000 mg | ORAL_TABLET | Freq: Every day | ORAL | 6 refills | Status: DC

## 2019-07-31 MED ORDER — EMTRICITABINE-TENOFOVIR AF 200-25 MG PO TABS: 1.0000 | ORAL_TABLET | Freq: Every day | ORAL | 6 refills | Status: DC

## 2019-07-31 NOTE — Progress Notes (Signed)
NAME: Janet Keller  DOB: 1941-10-13  MRN: [id number]  Date/Time: 07/31/2019 11:33 AM   Subjective:   ? Janet Keller is a 78 y.o. with a history of HIV/AIDS, hypertension, hyperlipidemia, diabetes mellitus is here for a  follow-up visit for HIV. She takes Descovy and Tivicay.  She is 100% adherent.  No side effects from the medication. She continues to work every day . On her last visit with her PCP on 07/30/2019 she was started on 100 mg gabapentin 3 times a day for peripheral neuropathy .  Not filled the prescription yet.  HIV was acquired through heterosexual transmission and she is not sure when she was exactly diagnosed.  She is unaware of a nadir CD4 count.  Visit with me on 09/17/2018 because of side effects.  She is doing well now her last labs have been from 10/10/2018 with viral load of less than 20 and CD4 more than 800.  Not aware of her prior regimens except Isentress and Combivir which she was on before changing to [facility] in January 2019.  She is to follow-up at [facility] and then with Dr. Clayborn Bigness and then Dr. Ola Spurr.  She was on Biktarvy which was changed to [place] plus Tivicay on 09/17/2018 because of stomach bloating and nervousness   Medical history Diabetes mellitus Hypertension Hyperlipidemia Fibroid uterus Malt lymphoma  Past surgical history Breast excisional biopsy on the left side  cataract extraction Tubal ligation Hammertoe surgery                    Social history Past smoker. 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 One son passed away due to diabetes  ?   Family History  Problem Relation Age of Onset  . Breast cancer Mother 52   No Known Allergies   Current meds Glipizide 5mg   Once a day Omeprazole Aspirin 81mg  Descovy  Tivicay Enalapril  10mg  X 2 20mg  a day Metoprol tartrate  25mg  Po BID Hydralazine 25mg  Po BID Lovastatin 40mg  at night   Hydrochlorthiazide 12.5 mg once a day Metformin 500mg   BID Calcium 600mg  + vitamin d3 800Iu BID Naproxen 220mg  PRN Gabapentin ?  REVIEW OF SYSTEMS:  Const: negative fever, negative chills, negative weight loss Eyes: negative diplopia or visual changes, negative eye pain ENT: negative coryza, negative sore throat Resp: negative cough, hemoptysis, dyspnea Cards: negative for chest pain, palpitations, lower extremity edema GU: negative for frequency, dysuria and hematuria Skin: negative for rash and pruritus Heme: negative for easy bruising and gum/nose bleeding MS: negative for myalgias, arthralgias, back pain and muscle weakness Neurolo:negative for headaches, dizziness, vertigo, memory problems  Psych: negative for feelings of anxiety, depression   Objective:  BP (!) 151/75   Pulse 68   Temp 98.7 F (37.1 C)   Wt 144 lb 6.4 oz (65.5 kg)   BMI 24.79 kg/m   PHYSICAL EXAM:  General: Cheerful and happy.  Looks young for her age.   Head: Normocephalic, without obvious abnormality, atraumatic. Eyes: Conjunctivae clear, anicteric sclerae. Pupils are equal Nose: Did not examine because of mask neck: Supple, symmetrical, no adenopathy, thyroid: non tender no carotid bruit and no JVD. Back: No CVA tenderness. Lungs: Clear to auscultation bilaterally. No Wheezing or Rhonchi. No rales. Heart: Regular rate and rhythm, no murmur, rub or gallop. Abdomen: Soft, non-tender,not distended. Bowel sounds normal. No masses Extremities: Extremities normal, atraumatic, no  cyanosis. No edema. No clubbing Skin: No rashes or lesions. Not Jaundiced Lymph: Cervical, supraclavicular normal. Neurologic: Grossly non-focal  Health maintenance  Vaccine Date last given comment  Influenza Sept 2019 Given at work  Hepatitis B    Hepatitis A    Prevnar-PCV-13 02/06/2017   Pneumovac-PPSV-23 02/08/2011   TdaP 05/10/2012   HPV    Shingrix ( zoster vaccine)      ______________________  Labs Lab Result  Date comment  HIV VL <20 09/30/18   CD4 850(25%) 09/30/18   Genotype     [id number]     HIV antibody     RPR NR 09/30/18   Quantiferon Gold Neg 09/30/18   Hep C ab NEg 08/01/16   Hepatitis B-ab,ag,c     Hepatitis A-IgM, IgG /T     Lipid(TC,HDL,LDL,TG) 168/39/92/183 01/20/19   GC/CHL     PAP     Creatinine  1.1 2/24     Preventive  Procedure Result  Date comment  colonoscopy     Mammogram Normal 12/02/18 Dense breast  tisue  Dental exam cleaning Feb 2020   Opthal          Impression/Recommendation ? 79 year old female with history of HIV, hypertension and diabetes mellitus  HIV/AIDS .  Well controlled with undetectable viral load and CD4 of 12/04/1948.  She will have labs today.  Currently on Descovy and Tivicay 100% adherent to it.  Drug interactions: With dolutegravir and metformin need to watch out for blood glucose and metformin cannot exceed be on thousand milligrams a day.  She is currently on 500 mg twice daily and she is not hypoglycemic. Also she is on calcium carbonate which will have to be taken 6 hours later or 2 hours before dolutegravir (tivicay) Which is what she is doing now.  Diabetes mellitus on metformin spinal milligrams twice daily and glipizide 5 mg.  Last hemoglobin A1c from 07/23/2019 is 6.8. TSH from 05/09/2019 is 1.045 normal. ? ?Hyperlipidemia on lovastatin: Last lipids are from 01/20/2019 with a cholesterol of 168, triglyceride of 183, HDL of 39 and LDL of 92.  Hypertension on hydrochlorothiazide, hydralazine and enalapril.  Blood pressure today is 151/75.  Her last creatinine is 1.1 from 07/23/2019.  Recently started on gabapentin by her PCP for neuropathy  Patient says she had colonoscopy sometime ago but I do not see any result.  Patient will schedule a dental appointment soon. ___________________________________________________ Discussed with patient in great detail.   Commended her on 100% adherence to medication and in keeping the virus under control.  We will see her in 6 months.

## 2019-07-31 NOTE — Patient Instructions (Signed)
You are here for follow up of HIV disease. You will do labs today- continue Tivicay and Descovy- follow up in 6 months

## 2019-08-01 LAB — T-HELPER CELLS CD4/CD8 %
% CD 4 Pos. Lymph.: 25.5 % — ABNORMAL LOW (ref 30.8–58.5)
Absolute CD 4 Helper: 689 /uL (ref 359–1519)
Basophils Absolute: 0.1 10*3/uL (ref 0.0–0.2)
Basos: 1 %
CD3+CD4+ Cells/CD3+CD8+ Cells Bld: 0.46 — ABNORMAL LOW (ref 0.92–3.72)
CD3+CD8+ Cells # Bld: 1485 /uL — ABNORMAL HIGH (ref 109–897)
CD3+CD8+ Cells NFr Bld: 55 % — ABNORMAL HIGH (ref 12.0–35.5)
EOS (ABSOLUTE): 0.1 10*3/uL (ref 0.0–0.4)
Eos: 2 %
Hematocrit: 33.8 % — ABNORMAL LOW (ref 34.0–46.6)
Hemoglobin: 11.6 g/dL (ref 11.1–15.9)
Immature Grans (Abs): 0 10*3/uL (ref 0.0–0.1)
Immature Granulocytes: 0 %
Lymphocytes Absolute: 2.7 10*3/uL (ref 0.7–3.1)
Lymphs: 44 %
MCH: 32.3 pg (ref 26.6–33.0)
MCHC: 34.3 g/dL (ref 31.5–35.7)
MCV: 94 fL (ref 79–97)
Monocytes Absolute: 0.5 10*3/uL (ref 0.1–0.9)
Monocytes: 9 %
Neutrophils Absolute: 2.6 10*3/uL (ref 1.4–7.0)
Neutrophils: 44 %
Platelets: 256 10*3/uL (ref 150–450)
RBC: 3.59 x10E6/uL — ABNORMAL LOW (ref 3.77–5.28)
RDW: 13.6 % (ref 11.7–15.4)
WBC: 6 10*3/uL (ref 3.4–10.8)

## 2019-08-01 LAB — HIV-1 RNA QUANT-NO REFLEX-BLD
HIV 1 RNA Quant: 620 copies/mL
LOG10 HIV-1 RNA: 2.792 log10copy/mL

## 2019-08-01 LAB — RPR: RPR Ser Ql: NONREACTIVE

## 2019-08-01 LAB — HEPATITIS B SURFACE ANTIGEN: Hepatitis B Surface Ag: NEGATIVE

## 2019-08-01 LAB — HEPATITIS B SURFACE ANTIBODY, QUANTITATIVE: Hep B S AB Quant (Post): <3.1 m[IU]/mL — ABNORMAL LOW (ref >9.9)

## 2019-08-01 LAB — HEPATITIS C ANTIBODY: HCV Ab: <0.1 s/co ratio (ref 0.0–0.9)

## 2019-08-01 LAB — HEPATITIS A ANTIBODY, TOTAL: hep A Total Ab: POSITIVE — AB

## 2019-08-30 ENCOUNTER — Other Ambulatory Visit: Admission: RE | Admit: 2019-08-30 | Payer: 59 | Source: Ambulatory Visit | Admitting: Family Medicine

## 2019-12-10 ENCOUNTER — Other Ambulatory Visit: Payer: Self-pay | Admitting: Internal Medicine

## 2019-12-10 DIAGNOSIS — Z1231 Encounter for screening mammogram for malignant neoplasm of breast: Secondary | ICD-10-CM

## 2019-12-15 ENCOUNTER — Ambulatory Visit
Admission: RE | Admit: 2019-12-15 | Discharge: 2019-12-15 | Disposition: A | Discharge status: Home / Self Care | Payer: 59 | Source: Ambulatory Visit | Attending: Internal Medicine | Admitting: Internal Medicine

## 2019-12-15 DIAGNOSIS — Z1231 Encounter for screening mammogram for malignant neoplasm of breast: Secondary | ICD-10-CM | POA: Insufficient documentation

## 2019-12-30 ENCOUNTER — Telehealth: Payer: Self-pay | Admitting: Infectious Diseases

## 2019-12-30 NOTE — Telephone Encounter (Signed)
Patient called and left voicemail to have [facility] call her back.  She has questions about a medicine program  [phone]  Requesting call back   Voicemail from 12/29/2019

## 2020-01-01 ENCOUNTER — Telehealth: Payer: Self-pay | Admitting: Infectious Diseases

## 2020-01-01 NOTE — Telephone Encounter (Signed)
Questions about HIV medicine    Support Group?   Requesting call back when get a chance.   [phone]

## 2020-01-05 NOTE — Telephone Encounter (Signed)
Please indicate any support groups and I will call patient to go over.

## 2020-01-05 NOTE — Telephone Encounter (Signed)
Awaiting list of support groups. Left message

## 2020-01-08 NOTE — Telephone Encounter (Signed)
Let me know if you get info on support or call her pls. I emailed Shanon Brow.

## 2020-01-29 ENCOUNTER — Ambulatory Visit: Payer: 59 | Admitting: Infectious Diseases

## 2020-02-26 ENCOUNTER — Ambulatory Visit (HOSPITAL_BASED_OUTPATIENT_CLINIC_OR_DEPARTMENT_OTHER): Payer: Medicare HMO | Admitting: Infectious Diseases

## 2020-02-26 ENCOUNTER — Other Ambulatory Visit
Admission: RE | Admit: 2020-02-26 | Discharge: 2020-02-26 | Disposition: A | Discharge status: Home / Self Care | Payer: Medicare HMO | Source: Ambulatory Visit | Attending: Infectious Diseases | Admitting: Infectious Diseases

## 2020-02-26 ENCOUNTER — Other Ambulatory Visit: Payer: Self-pay

## 2020-02-26 ENCOUNTER — Encounter: Payer: Self-pay | Admitting: Infectious Diseases

## 2020-02-26 VITALS — BP 186/83 | HR 98 | Resp 16 | Ht 64.0 in | Wt 148.0 lb

## 2020-02-26 DIAGNOSIS — Z8572 Personal history of non-Hodgkin lymphomas: Secondary | ICD-10-CM | POA: Insufficient documentation

## 2020-02-26 DIAGNOSIS — I10 Essential (primary) hypertension: Secondary | ICD-10-CM | POA: Insufficient documentation

## 2020-02-26 DIAGNOSIS — B20 Human immunodeficiency virus [HIV] disease: Secondary | ICD-10-CM | POA: Insufficient documentation

## 2020-02-26 DIAGNOSIS — Z87891 Personal history of nicotine dependence: Secondary | ICD-10-CM | POA: Insufficient documentation

## 2020-02-26 DIAGNOSIS — E785 Hyperlipidemia, unspecified: Secondary | ICD-10-CM | POA: Insufficient documentation

## 2020-02-26 DIAGNOSIS — Z79899 Other long term (current) drug therapy: Secondary | ICD-10-CM | POA: Insufficient documentation

## 2020-02-26 DIAGNOSIS — E1140 Type 2 diabetes mellitus with diabetic neuropathy, unspecified: Secondary | ICD-10-CM | POA: Insufficient documentation

## 2020-02-26 DIAGNOSIS — Z7984 Long term (current) use of oral hypoglycemic drugs: Secondary | ICD-10-CM | POA: Insufficient documentation

## 2020-02-26 DIAGNOSIS — Z7982 Long term (current) use of aspirin: Secondary | ICD-10-CM | POA: Insufficient documentation

## 2020-02-26 LAB — CBC WITH DIFFERENTIAL/PLATELET
Abs Immature Granulocytes: 0.02 10*3/uL (ref 0.00–0.07)
Basophils Absolute: 0.1 10*3/uL (ref 0.0–0.1)
Basophils Relative: 1 %
Eosinophils Absolute: 0.1 10*3/uL (ref 0.0–0.5)
Eosinophils Relative: 1 %
HCT: 33.8 % — ABNORMAL LOW (ref 36.0–46.0)
Hemoglobin: 11.6 g/dL — ABNORMAL LOW (ref 12.0–15.0)
Immature Granulocytes: 0 %
Lymphocytes Relative: 42 %
Lymphs Abs: 2.1 10*3/uL (ref 0.7–4.0)
MCH: 32 pg (ref 26.0–34.0)
MCHC: 34.3 g/dL (ref 30.0–36.0)
MCV: 93.4 fL (ref 80.0–100.0)
Monocytes Absolute: 0.4 10*3/uL (ref 0.1–1.0)
Monocytes Relative: 8 %
Neutro Abs: 2.3 10*3/uL (ref 1.7–7.7)
Neutrophils Relative %: 48 %
Platelets: 237 10*3/uL (ref 150–400)
RBC: 3.62 MIL/uL — ABNORMAL LOW (ref 3.87–5.11)
RDW: 13.8 % (ref 11.5–15.5)
WBC: 4.9 10*3/uL (ref 4.0–10.5)
nRBC: 0 % (ref 0.0–0.2)

## 2020-02-26 LAB — COMPREHENSIVE METABOLIC PANEL
ALT: 20 U/L (ref 0–44)
AST: 25 U/L (ref 15–41)
Albumin: 4.1 g/dL (ref 3.5–5.0)
Alkaline Phosphatase: 50 U/L (ref 38–126)
Anion gap: 10 (ref 5–15)
BUN: 16 mg/dL (ref 8–23)
CO2: 23 mmol/L (ref 22–32)
Calcium: 9.6 mg/dL (ref 8.9–10.3)
Chloride: 102 mmol/L (ref 98–111)
Creatinine, Ser: 1.01 mg/dL — ABNORMAL HIGH (ref 0.44–1.00)
GFR calc Af Amer: >60 mL/min (ref >60)
GFR calc non Af Amer: 53 mL/min — ABNORMAL LOW (ref >60)
Glucose, Bld: 118 mg/dL — ABNORMAL HIGH (ref 70–99)
Potassium: 3.9 mmol/L (ref 3.5–5.1)
Sodium: 135 mmol/L (ref 135–145)
Total Bilirubin: 0.9 mg/dL (ref 0.3–1.2)
Total Protein: 7.3 g/dL (ref 6.5–8.1)

## 2020-02-26 MED ORDER — EMTRICITABINE-TENOFOVIR AF 200-25 MG PO TABS: 1.0000 | ORAL_TABLET | Freq: Every day | ORAL | 6 refills | Status: DC

## 2020-02-26 MED ORDER — TIVICAY 50 MG PO TABS: 50.0000 mg | ORAL_TABLET | Freq: Every day | ORAL | 6 refills | Status: DC

## 2020-02-26 NOTE — Addendum Note (Signed)
Addended by: Santiago Bur on: 02/26/2020 10:20 AM   Modules accepted: Orders

## 2020-02-26 NOTE — Addendum Note (Signed)
Addended by: Santiago Bur on: 02/26/2020 10:21 AM   Modules accepted: Orders

## 2020-02-26 NOTE — Patient Instructions (Signed)
You are ehre for follow up and you are on tivicay and descovy- Today will do labs and will see you in 6 months- You are doing well from the virus point of view

## 2020-02-26 NOTE — Progress Notes (Signed)
NAME: Janet Keller  DOB: 21-Oct-1941  MRN: [id number]  Date/Time: 02/26/2020 9:52 AM   Subjective:   ?follow up visit for HIV Last visit was in [place] is a 79 y.o.with a history of HIV/AIDS, hypertension, hyperlipidemia, diabetes mellitus  Since her last visit she retired 2 months ago. She had low back pain and her PCP referred to pain management and PT Also she was given meloxicam 100% adherent to HAARt- tivicay and Descovy Last VL was 60 and cd4 was 689    HIV was acquired through heterosexual transmission and she is not sure when she was exactly diagnosed.  She is unaware of a nadir CD4 count.  Visit with me on 09/17/2018 because of side effects.  She is doing well now her last labs have been from 10/10/2018 with viral load of less than 20 and CD4 more than 800.  Not aware of her prior regimens except Isentress and Combivir which she was on before changing to [facility] in January 2019.  She is to follow-up at [facility] and then with Dr. Clayborn Bigness and then Dr. Ola Spurr.  She was on Biktarvy which was changed to [place] plus Tivicay on 09/17/2018 because of stomach bloating and nervousness   Medical history Diabetes mellitus Hypertension Hyperlipidemia Fibroid uterus Malt lymphoma  Past surgical history Breast excisional biopsy on the left side  cataract extraction Tubal ligation Hammertoe surgery                    Social history Past smoker. 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 One son passed away due to diabetes  ?   Family History  Problem Relation Age of Onset  . Breast cancer Mother 26   No Known Allergies   Current meds Glipizide 5mg   Once a day Omeprazole Aspirin 81mg  Descovy  Tivicay Enalapril  10mg  X 2 20mg  a day Metoprol tartrate  25mg  Po BID Hydralazine 25mg  Po BID Lovastatin 40mg  at night  Hydrochlorthiazide 12.5 mg once a  day Metformin 500mg   BID Calcium 600mg  + vitamin d3 800Iu BID Meloxicam  Gabapentin ?  REVIEW OF SYSTEMS:  Const: negative fever, negative chills, negative weight loss Eyes: negative diplopia or visual changes, negative eye pain ENT: negative coryza, negative sore throat Resp: negative cough, hemoptysis, dyspnea Cards: negative for chest pain, palpitations, lower extremity edema GU: negative for frequency, dysuria and hematuria Skin: negative for rash and pruritus Heme: negative for easy bruising and gum/nose bleeding MS: back pain- better Numbness fingers Neurolo:negative for headaches, dizziness, vertigo, memory problems  Psych: negative for feelings of anxiety, depression   Objective:  BP (!) 186/83   Pulse 98   Resp 16   Ht 5\' 4"  (1.626 m)   Wt 148 lb (67.1 kg)   SpO2 98%   BMI 25.40 kg/m   PHYSICAL EXAM:  General: looks well   Head: Normocephalic, without obvious abnormality, atraumatic. Eyes: Conjunctivae clear, anicteric sclerae. Pupils are equal Nose: Did not examine because of mask neck: Supple, symmetrical, no adenopathy, thyroid: non tender no carotid bruit and no JVD. Back: No CVA tenderness. Lungs: Clear to auscultation bilaterally. No Wheezing or Rhonchi. No rales. Heart: Regular rate and rhythm, no murmur, rub or gallop. Abdomen: Soft, non-tender,not distended. Bowel sounds normal. No masses Extremities: Extremities normal, atraumatic, no cyanosis. No edema. No clubbing Skin: No rashes or lesions. Not  Jaundiced Lymph: Cervical, supraclavicular normal. Neurologic: Grossly non-focal  Health maintenance  Vaccine Date last given comment  Influenza Sept 2019 Given at work  Hepatitis B    Hepatitis A    Prevnar-PCV-13 02/06/2017   Pneumovac-PPSV-23 02/08/2011   TdaP 05/10/2012   HPV    Shingrix ( zoster vaccine)     ______________________  Labs Lab Result  Date comment  HIV VL <20 09/30/18   CD4 850(25%) 09/30/18   Genotype      [id number]     HIV antibody     RPR NR 09/30/18   Quantiferon Gold Neg 09/30/18   Hep C ab NEg 08/01/16   Hepatitis B-ab,ag,c     Hepatitis A-IgM, IgG /T     Lipid(TC,HDL,LDL,TG) 166/44/92/141 01/21/20   GC/CHL     PAP     Creatinine  1.1 2/24     Preventive  Procedure Result  Date comment  colonoscopy     Mammogram Normal 12/02/18 Dense breast  tisue  Dental exam cleaning Feb 2020   Opthal          Impression/Recommendation ? 79 year old female with history of HIV, hypertension and diabetes mellitus  HIV/AIDS .  Well controlled with undetectable viral load and CD4 of 12/04/1948.  She will have labs today.  Currently on Descovy and Tivicay 100% adherent to it.  Drug interactions: With dolutegravir and metformin need to watch out for blood glucose and metformin cannot exceed be on thousand milligrams a day.  She is currently on 500 mg twice daily and she is not hypoglycemic. Also she is on calcium carbonate which will have to be taken 6 hours later or 2 hours before dolutegravir (tivicay) Which is what she is doing now.  Diabetes mellitus on metformin spinal milligrams twice daily and glipizide 5 mg.  Last hemoglobin A1c from 01/21/20 is 6.5. TSH from 05/09/2019 is 1.045 normal. ? ?Hyperlipidemia on lovastatin: Last lipids are from 01/21/2020 with a cholesterol of 166, triglyceride of 141, HDL of 44 and LDL of 93.  Hypertension on hydrochlorothiazide, hydralazine and enalapril.   Last cr 1.2 Will check today   Neuropathy on gabapentin Low back ache- on PT and was also started on meloxicam-  As she is feeling better and  as cr slightly increasing asked her to not take it    Patient will schedule a dental appointment soon. ___________________________________________________ Discussed with patient in great detail.  .  We will see her in 6 months.

## 2020-02-27 LAB — T-HELPER CELLS CD4/CD8 %
% CD 4 Pos. Lymph.: 23.3 % — ABNORMAL LOW (ref 30.8–58.5)
Absolute CD 4 Helper: 489 /uL (ref 359–1519)
Basophils Absolute: 0 10*3/uL (ref 0.0–0.2)
Basos: 1 %
CD3+CD4+ Cells/CD3+CD8+ Cells Bld: 0.41 — ABNORMAL LOW (ref 0.92–3.72)
CD3+CD8+ Cells # Bld: 1208 /uL — ABNORMAL HIGH (ref 109–897)
CD3+CD8+ Cells NFr Bld: 57.5 % — ABNORMAL HIGH (ref 12.0–35.5)
EOS (ABSOLUTE): 0.1 10*3/uL (ref 0.0–0.4)
Eos: 1 %
Hematocrit: 35.5 % (ref 34.0–46.6)
Hemoglobin: 12.1 g/dL (ref 11.1–15.9)
Immature Grans (Abs): 0 10*3/uL (ref 0.0–0.1)
Immature Granulocytes: 0 %
Lymphocytes Absolute: 2.1 10*3/uL (ref 0.7–3.1)
Lymphs: 43 %
MCH: 32.5 pg (ref 26.6–33.0)
MCHC: 34.1 g/dL (ref 31.5–35.7)
MCV: 95 fL (ref 79–97)
Monocytes Absolute: 0.3 10*3/uL (ref 0.1–0.9)
Monocytes: 6 %
Neutrophils Absolute: 2.4 10*3/uL (ref 1.4–7.0)
Neutrophils: 49 %
Platelets: 235 10*3/uL (ref 150–450)
RBC: 3.72 x10E6/uL — ABNORMAL LOW (ref 3.77–5.28)
RDW: 14 % (ref 11.7–15.4)
WBC: 4.8 10*3/uL (ref 3.4–10.8)

## 2020-02-27 LAB — HIV-1 RNA QUANT-NO REFLEX-BLD
HIV 1 RNA Quant: <20 copies/mL
LOG10 HIV-1 RNA: UNDETERMINED log10copy/mL

## 2020-02-29 LAB — QUANTIFERON-TB GOLD PLUS (RQFGPL)
QuantiFERON Mitogen Value: >10 IU/mL
QuantiFERON Nil Value: 0.25 IU/mL
QuantiFERON TB1 Ag Value: 0.2 IU/mL
QuantiFERON TB2 Ag Value: 0.2 IU/mL

## 2020-02-29 LAB — QUANTIFERON-TB GOLD PLUS: QuantiFERON-TB Gold Plus: NEGATIVE

## 2020-03-04 ENCOUNTER — Ambulatory Visit: Payer: Medicare HMO | Attending: Infectious Diseases

## 2020-03-04 ENCOUNTER — Other Ambulatory Visit: Payer: Self-pay

## 2020-03-19 ENCOUNTER — Other Ambulatory Visit: Payer: Self-pay | Admitting: Physical Medicine & Rehabilitation

## 2020-03-19 DIAGNOSIS — M5416 Radiculopathy, lumbar region: Secondary | ICD-10-CM

## 2020-03-19 DIAGNOSIS — M5441 Lumbago with sciatica, right side: Secondary | ICD-10-CM | POA: Insufficient documentation

## 2020-04-02 ENCOUNTER — Ambulatory Visit
Admission: RE | Admit: 2020-04-02 | Discharge: 2020-04-02 | Disposition: A | Discharge status: Home / Self Care | Payer: Medicare HMO | Source: Ambulatory Visit | Attending: Physical Medicine & Rehabilitation | Admitting: Physical Medicine & Rehabilitation

## 2020-04-02 ENCOUNTER — Other Ambulatory Visit: Payer: Self-pay

## 2020-04-02 DIAGNOSIS — M5416 Radiculopathy, lumbar region: Secondary | ICD-10-CM

## 2020-04-06 DIAGNOSIS — M48061 Spinal stenosis, lumbar region without neurogenic claudication: Secondary | ICD-10-CM | POA: Insufficient documentation

## 2020-06-11 ENCOUNTER — Other Ambulatory Visit: Payer: Self-pay | Admitting: Otolaryngology

## 2020-06-11 DIAGNOSIS — IMO0001 Reserved for inherently not codable concepts without codable children: Secondary | ICD-10-CM

## 2020-06-26 ENCOUNTER — Ambulatory Visit: Payer: Medicare HMO

## 2020-08-28 ENCOUNTER — Ambulatory Visit
Admission: RE | Admit: 2020-08-28 | Discharge: 2020-08-28 | Disposition: A | Discharge status: Home / Self Care | Payer: Medicare HMO | Source: Ambulatory Visit | Attending: Otolaryngology | Admitting: Otolaryngology

## 2020-08-28 ENCOUNTER — Other Ambulatory Visit: Payer: Self-pay

## 2020-08-28 DIAGNOSIS — H9042 Sensorineural hearing loss, unilateral, left ear, with unrestricted hearing on the contralateral side: Secondary | ICD-10-CM | POA: Insufficient documentation

## 2020-08-28 DIAGNOSIS — IMO0001 Reserved for inherently not codable concepts without codable children: Secondary | ICD-10-CM

## 2020-08-28 MED ORDER — GADOBUTROL 1 MMOL/ML IV SOLN
6.0000 mL | Freq: Once | INTRAVENOUS | Status: AC | PRN
  Administered 2020-08-28: 6 mL via INTRAVENOUS

## 2020-08-31 ENCOUNTER — Encounter: Payer: Self-pay | Admitting: Infectious Diseases

## 2020-08-31 ENCOUNTER — Ambulatory Visit: Payer: Medicare HMO | Attending: Infectious Diseases | Admitting: Infectious Diseases

## 2020-08-31 ENCOUNTER — Other Ambulatory Visit: Payer: Self-pay

## 2020-08-31 ENCOUNTER — Other Ambulatory Visit
Admission: RE | Admit: 2020-08-31 | Discharge: 2020-08-31 | Disposition: A | Discharge status: Home / Self Care | Payer: Medicare HMO | Attending: Infectious Diseases | Admitting: Infectious Diseases

## 2020-08-31 VITALS — BP 186/86 | HR 76 | Temp 98.7°F | Resp 16 | Ht 64.0 in | Wt 143.0 lb

## 2020-08-31 DIAGNOSIS — B20 Human immunodeficiency virus [HIV] disease: Secondary | ICD-10-CM

## 2020-08-31 DIAGNOSIS — Z7984 Long term (current) use of oral hypoglycemic drugs: Secondary | ICD-10-CM | POA: Diagnosis not present

## 2020-08-31 DIAGNOSIS — I10 Essential (primary) hypertension: Secondary | ICD-10-CM | POA: Diagnosis not present

## 2020-08-31 DIAGNOSIS — E785 Hyperlipidemia, unspecified: Secondary | ICD-10-CM | POA: Diagnosis not present

## 2020-08-31 DIAGNOSIS — Z23 Encounter for immunization: Secondary | ICD-10-CM | POA: Insufficient documentation

## 2020-08-31 DIAGNOSIS — Z791 Long term (current) use of non-steroidal anti-inflammatories (NSAID): Secondary | ICD-10-CM | POA: Diagnosis not present

## 2020-08-31 DIAGNOSIS — Z7982 Long term (current) use of aspirin: Secondary | ICD-10-CM | POA: Diagnosis not present

## 2020-08-31 DIAGNOSIS — Z21 Asymptomatic human immunodeficiency virus [HIV] infection status: Secondary | ICD-10-CM | POA: Insufficient documentation

## 2020-08-31 DIAGNOSIS — E1140 Type 2 diabetes mellitus with diabetic neuropathy, unspecified: Secondary | ICD-10-CM | POA: Diagnosis not present

## 2020-08-31 DIAGNOSIS — Z87891 Personal history of nicotine dependence: Secondary | ICD-10-CM | POA: Diagnosis not present

## 2020-08-31 DIAGNOSIS — H9192 Unspecified hearing loss, left ear: Secondary | ICD-10-CM | POA: Diagnosis not present

## 2020-08-31 DIAGNOSIS — Z79899 Other long term (current) drug therapy: Secondary | ICD-10-CM | POA: Insufficient documentation

## 2020-08-31 DIAGNOSIS — M545 Low back pain, unspecified: Secondary | ICD-10-CM | POA: Diagnosis not present

## 2020-08-31 LAB — COMPREHENSIVE METABOLIC PANEL
ALT: 19 U/L (ref 0–44)
AST: 23 U/L (ref 15–41)
Albumin: 4.5 g/dL (ref 3.5–5.0)
Alkaline Phosphatase: 56 U/L (ref 38–126)
Anion gap: 11 (ref 5–15)
BUN: 15 mg/dL (ref 8–23)
CO2: 26 mmol/L (ref 22–32)
Calcium: 10 mg/dL (ref 8.9–10.3)
Chloride: 100 mmol/L (ref 98–111)
Creatinine, Ser: 0.95 mg/dL (ref 0.44–1.00)
GFR calc non Af Amer: 57 mL/min — ABNORMAL LOW (ref >60)
Glucose, Bld: 85 mg/dL (ref 70–99)
Potassium: 3.8 mmol/L (ref 3.5–5.1)
Sodium: 137 mmol/L (ref 135–145)
Total Bilirubin: 0.8 mg/dL (ref 0.3–1.2)
Total Protein: 7.3 g/dL (ref 6.5–8.1)

## 2020-08-31 NOTE — Patient Instructions (Signed)
You are here for follow up. You are on tivicay and descovy- you are doing well- last Vl < 20 and cd4 389. You have completed 3 pfizer covid vaccine Today you will get your influenza vaccine Do not take aleve, ibuprofen as it can increase your BP. Wills ee you in 6 months

## 2020-08-31 NOTE — Addendum Note (Signed)
Addended by: Theresia Majors A on: 08/31/2020 03:03 PM   Modules accepted: Orders

## 2020-08-31 NOTE — Progress Notes (Signed)
NAME: Janet Keller  DOB: 1941-02-20  MRN: [id number]  Date/Time: 08/31/2020 9:23 AM   Subjective:   ?follow up visit for HIV Last visit was in April 2021 Janet Keller is a 79 y.o.with a history of HIV/AIDS, hypertension, hyperlipidemia, diabetes mellitus  Since her last visit , saw Gyn and got pap smear, saw PCP for annual visit- completed booster Pfizer vaccine 100% adherent to HAARt- tivicay and Descovy Last VL was 20 and cd4 was 389  She has seen ENT for hearing deficit and   HIV was acquired through heterosexual transmission and she is not sure when she was exactly diagnosed.  She is unaware of a nadir CD4 count.  Visit with me on 09/17/2018 because of side effects.  She is doing well now her last labs have been from 10/10/2018 with viral load of less than 20 and CD4 more than 800.  Not aware of her prior regimens except Isentress and Combivir which she was on before changing to [facility] in January 2019.  She is to follow-up at [facility] and then with Dr. Clayborn Bigness and then Dr. Ola Spurr.  She was on Biktarvy which was changed to [place] plus Tivicay on 09/17/2018 because of stomach bloating and nervousness   Medical history Diabetes mellitus Hypertension Hyperlipidemia Fibroid uterus Malt lymphoma  Past surgical history Breast excisional biopsy on the left side  cataract extraction Tubal ligation Hammertoe surgery                    Social history Past smoker. 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 One son passed away due to diabetes  ?   Family History  Problem Relation Age of Onset  . Breast cancer Mother 31   No Known Allergies   Current meds Glipizide 5mg   Once a day Omeprazole Aspirin 81mg  Descovy  Tivicay Enalapril  10mg  X 2 20mg  a day Metoprol tartrate  25mg  Po BID Hydralazine 25mg  Po BID Lovastatin 40mg  at night  Hydrochlorthiazide 12.5 mg once  a day Metformin 500mg   BID Calcium 600mg  + vitamin d3 800Iu BID Meloxicam  Gabapentin ?  REVIEW OF SYSTEMS:  Const: negative fever, negative chills, negative weight loss Eyes: negative diplopia or visual changes, negative eye pain ENT: negative coryza, negative sore throat Resp: negative cough, hemoptysis, dyspnea Cards: negative for chest pain, palpitations, lower extremity edema GU: negative for frequency, dysuria and hematuria Skin: negative for rash and pruritus Heme: negative for easy bruising and gum/nose bleeding MS: back pain- better Numbness fingers Neurolo:negative for headaches, dizziness, vertigo, memory problems  Psych: negative for feelings of anxiety, depression   Objective:  BP (!) 186/86   Pulse 76   Temp 98.7 F (37.1 C) (Oral)   Resp 16   Ht 5\' 4"  (1.626 m)   Wt 143 lb (64.9 kg)   SpO2 98%   BMI 24.55 kg/m   PHYSICAL EXAM:  General: looks well   Head: Normocephalic, without obvious abnormality, atraumatic. Eyes: Conjunctivae clear, anicteric sclerae. Pupils are equal Nose: Did not examine because of mask neck: Supple, symmetrical, no adenopathy, thyroid: non tender no carotid bruit and no JVD. Back: No CVA tenderness. Lungs: Clear to auscultation bilaterally. No Wheezing or Rhonchi. No rales. Heart: Regular rate and rhythm, no murmur, rub or gallop. Abdomen: Soft, non-tender,not distended. Bowel sounds normal. No masses Extremities: Extremities normal, atraumatic, no cyanosis. No edema.  No clubbing Skin: No rashes or lesions. Not Jaundiced Lymph: Cervical, supraclavicular normal. Neurologic: Grossly non-focal  Health maintenance  Vaccine Date last given comment  Influenza Aug 31 2020 Given at work  Hepatitis B    Hepatitis A antibody positive   Prevnar-PCV-13 02/06/2017   Pneumovac-PPSV-23 02/08/2011   TdaP 05/10/2012   HPV    Shingrix ( zoster vaccine)    Covid vaccine Pfizer                   2/16, 3/9 and  08/26/20 ______________________  Labs Lab Result  Date comment  HIV VL <20 02/26/20   CD4 489 (23%) 02/26/20   Genotype     [id number]     HIV antibody     RPR NR 07/31/19   Quantiferon Gold Neg 02/26/20   Hep C ab NEg 07/31/19   Hepatitis B-ab,ag,c hepb s ab < 3.1 07/31/19   Hepatitis A-IgM, IgG /T positive 07/31/19   Lipid(TC,HDL,LDL,TG) 166/44/92/141 01/21/20   GC/CHL Neg 07/15/20   PAP  07/15/20   Creatinine  1.0 02/26/20     Preventive  Procedure Result  Date comment  colonoscopy     Mammogram Normal 12/15/19 Dense breast  tisue  Dental exam cleaning  2021   Opthal           Impression/Recommendation ? 79 year old female with history of HIV, hypertension and diabetes mellitus  HIV/AIDS .  Well controlled with undetectable viral load and CD4 of 12/04/1948.  She will have labs today.  Currently on Descovy and Tivicay 100% adherent to it.  Drug interactions: With dolutegravir and metformin need to watch out for blood glucose and metformin cannot exceed be on thousand milligrams a day.  She is currently on 500 mg twice daily and she is not hypoglycemic. Also she is on calcium carbonate which will have to be taken 6 hours later or 2 hours before dolutegravir (tivicay) Which is what she is doing now.  Diabetes mellitus on metformin 500mg   twice daily and glipizide 5  mg.  Last hemoglobin A1c from 01/21/20 is 6.5. TSH from 05/09/2019 is 1.045 normal. ? ?Hyperlipidemia on lovastatin: Last lipids are from 01/21/2020 with a cholesterol of 166, triglyceride of 141, HDL of 44 and LDL of 93.  Left EAR hearing loss- MRI IAC N done on 08/28/20  Hypertension on hydrochlorothiazide, hydralazine and enalapril.  Today it was high but she has not taken her morning meds yet Last cr 1.0 Will check today   Neuropathy-- she is not consistent with gabapentin- does PRN  Low back ache- much better- PRN aleve- told her to take tylenol instead because of HTN and cr  Flu  vaccine today ___________________________________________________ Discussed with patient in great detail.  .  We will see her in 6 months.

## 2020-09-01 LAB — T-HELPER CELLS CD4/CD8 %
% CD 4 Pos. Lymph.: 25.5 % — ABNORMAL LOW (ref 30.8–58.5)
Absolute CD 4 Helper: 638 /uL (ref 359–1519)
Basophils Absolute: 0.1 10*3/uL (ref 0.0–0.2)
Basos: 1 %
CD3+CD4+ Cells/CD3+CD8+ Cells Bld: 0.46 — ABNORMAL LOW (ref 0.92–3.72)
CD3+CD8+ Cells # Bld: 1393 /uL — ABNORMAL HIGH (ref 109–897)
CD3+CD8+ Cells NFr Bld: 55.7 % — ABNORMAL HIGH (ref 12.0–35.5)
EOS (ABSOLUTE): 0.1 10*3/uL (ref 0.0–0.4)
Eos: 2 %
Hematocrit: 34.5 % (ref 34.0–46.6)
Hemoglobin: 11.7 g/dL (ref 11.1–15.9)
Immature Grans (Abs): 0 10*3/uL (ref 0.0–0.1)
Immature Granulocytes: 0 %
Lymphocytes Absolute: 2.5 10*3/uL (ref 0.7–3.1)
Lymphs: 41 %
MCH: 32.2 pg (ref 26.6–33.0)
MCHC: 33.9 g/dL (ref 31.5–35.7)
MCV: 95 fL (ref 79–97)
Monocytes Absolute: 0.5 10*3/uL (ref 0.1–0.9)
Monocytes: 8 %
Neutrophils Absolute: 2.9 10*3/uL (ref 1.4–7.0)
Neutrophils: 48 %
Platelets: 269 10*3/uL (ref 150–450)
RBC: 3.63 x10E6/uL — ABNORMAL LOW (ref 3.77–5.28)
RDW: 13.3 % (ref 11.7–15.4)
WBC: 6.1 10*3/uL (ref 3.4–10.8)

## 2020-09-01 LAB — HIV-1 RNA QUANT-NO REFLEX-BLD
HIV 1 RNA Quant: <20 copies/mL
LOG10 HIV-1 RNA: UNDETERMINED log10copy/mL

## 2020-09-01 LAB — RPR: RPR Ser Ql: NONREACTIVE

## 2020-09-21 ENCOUNTER — Other Ambulatory Visit: Payer: Self-pay | Admitting: Infectious Diseases

## 2020-09-21 DIAGNOSIS — B20 Human immunodeficiency virus [HIV] disease: Secondary | ICD-10-CM

## 2020-09-21 MED ORDER — TIVICAY 50 MG PO TABS: 50.0000 mg | ORAL_TABLET | Freq: Every day | ORAL | 6 refills | Status: DC

## 2020-09-21 MED ORDER — EMTRICITABINE-TENOFOVIR AF 200-25 MG PO TABS: 1.0000 | ORAL_TABLET | Freq: Every day | ORAL | 6 refills | Status: DC

## 2020-09-23 ENCOUNTER — Telehealth: Payer: Self-pay

## 2020-09-23 NOTE — Telephone Encounter (Signed)
Spoke to [place] at [facility] and she had me instruct patient to call [employer] patient advocate [phone].for further assistance. Patient reports her medications were at pharmacy and covered but said they did not give her one of them. I advised we sent both and to call them and see when she can pick up [place].

## 2020-11-30 DIAGNOSIS — M79672 Pain in left foot: Secondary | ICD-10-CM | POA: Diagnosis not present

## 2020-11-30 DIAGNOSIS — I10 Essential (primary) hypertension: Secondary | ICD-10-CM | POA: Diagnosis not present

## 2020-11-30 DIAGNOSIS — E119 Type 2 diabetes mellitus without complications: Secondary | ICD-10-CM | POA: Diagnosis not present

## 2020-11-30 DIAGNOSIS — M79671 Pain in right foot: Secondary | ICD-10-CM | POA: Diagnosis not present

## 2020-11-30 DIAGNOSIS — R0602 Shortness of breath: Secondary | ICD-10-CM | POA: Diagnosis not present

## 2020-11-30 DIAGNOSIS — R002 Palpitations: Secondary | ICD-10-CM | POA: Diagnosis not present

## 2020-11-30 DIAGNOSIS — E782 Mixed hyperlipidemia: Secondary | ICD-10-CM | POA: Diagnosis not present

## 2020-12-09 DIAGNOSIS — H524 Presbyopia: Secondary | ICD-10-CM | POA: Diagnosis not present

## 2020-12-09 DIAGNOSIS — E113393 Type 2 diabetes mellitus with moderate nonproliferative diabetic retinopathy without macular edema, bilateral: Secondary | ICD-10-CM | POA: Diagnosis not present

## 2020-12-11 DIAGNOSIS — Z20822 Contact with and (suspected) exposure to covid-19: Secondary | ICD-10-CM | POA: Diagnosis not present

## 2020-12-14 DIAGNOSIS — R002 Palpitations: Secondary | ICD-10-CM | POA: Diagnosis not present

## 2020-12-17 ENCOUNTER — Other Ambulatory Visit: Payer: Self-pay | Admitting: Internal Medicine

## 2020-12-17 DIAGNOSIS — Z1231 Encounter for screening mammogram for malignant neoplasm of breast: Secondary | ICD-10-CM

## 2021-01-06 ENCOUNTER — Other Ambulatory Visit: Payer: Self-pay | Admitting: Internal Medicine

## 2021-01-06 DIAGNOSIS — Z1231 Encounter for screening mammogram for malignant neoplasm of breast: Secondary | ICD-10-CM

## 2021-01-12 DIAGNOSIS — I38 Endocarditis, valve unspecified: Secondary | ICD-10-CM | POA: Insufficient documentation

## 2021-01-13 ENCOUNTER — Telehealth: Payer: Self-pay

## 2021-01-13 NOTE — Telephone Encounter (Signed)
Faxed [facility] form signed by MD.

## 2021-01-27 ENCOUNTER — Ambulatory Visit
Admission: RE | Admit: 2021-01-27 | Discharge: 2021-01-27 | Disposition: A | Discharge status: Home / Self Care | Payer: Medicare HMO | Source: Ambulatory Visit | Attending: Internal Medicine | Admitting: Internal Medicine

## 2021-01-27 ENCOUNTER — Other Ambulatory Visit: Payer: Self-pay

## 2021-01-27 DIAGNOSIS — Z1231 Encounter for screening mammogram for malignant neoplasm of breast: Secondary | ICD-10-CM | POA: Diagnosis not present

## 2021-02-03 ENCOUNTER — Other Ambulatory Visit: Payer: Self-pay | Admitting: Internal Medicine

## 2021-02-03 DIAGNOSIS — R1013 Epigastric pain: Secondary | ICD-10-CM

## 2021-02-08 ENCOUNTER — Ambulatory Visit: Payer: Medicare HMO

## 2021-02-17 ENCOUNTER — Ambulatory Visit: Payer: Medicare HMO

## 2021-02-22 ENCOUNTER — Other Ambulatory Visit: Payer: Self-pay

## 2021-02-22 ENCOUNTER — Encounter: Payer: Self-pay | Admitting: Infectious Diseases

## 2021-02-22 ENCOUNTER — Ambulatory Visit: Payer: Medicare HMO | Attending: Infectious Diseases | Admitting: Infectious Diseases

## 2021-02-22 VITALS — BP 175/73 | HR 69 | Temp 98.0°F | Wt 141.0 lb

## 2021-02-22 DIAGNOSIS — B20 Human immunodeficiency virus [HIV] disease: Secondary | ICD-10-CM | POA: Insufficient documentation

## 2021-02-22 DIAGNOSIS — I10 Essential (primary) hypertension: Secondary | ICD-10-CM | POA: Insufficient documentation

## 2021-02-22 DIAGNOSIS — Z87891 Personal history of nicotine dependence: Secondary | ICD-10-CM | POA: Diagnosis not present

## 2021-02-22 DIAGNOSIS — M545 Low back pain, unspecified: Secondary | ICD-10-CM | POA: Insufficient documentation

## 2021-02-22 DIAGNOSIS — E1140 Type 2 diabetes mellitus with diabetic neuropathy, unspecified: Secondary | ICD-10-CM | POA: Diagnosis not present

## 2021-02-22 DIAGNOSIS — H9192 Unspecified hearing loss, left ear: Secondary | ICD-10-CM | POA: Insufficient documentation

## 2021-02-22 DIAGNOSIS — C884 Extranodal marginal zone B-cell lymphoma of mucosa-associated lymphoid tissue [MALT-lymphoma]: Secondary | ICD-10-CM | POA: Diagnosis not present

## 2021-02-22 DIAGNOSIS — G9009 Other idiopathic peripheral autonomic neuropathy: Secondary | ICD-10-CM

## 2021-02-22 DIAGNOSIS — Z7984 Long term (current) use of oral hypoglycemic drugs: Secondary | ICD-10-CM | POA: Diagnosis not present

## 2021-02-22 DIAGNOSIS — Z7982 Long term (current) use of aspirin: Secondary | ICD-10-CM | POA: Insufficient documentation

## 2021-02-22 DIAGNOSIS — Z79899 Other long term (current) drug therapy: Secondary | ICD-10-CM | POA: Diagnosis not present

## 2021-02-22 DIAGNOSIS — E785 Hyperlipidemia, unspecified: Secondary | ICD-10-CM | POA: Diagnosis not present

## 2021-02-22 NOTE — Patient Instructions (Addendum)
You are here fro follow up of HIV- yu are on tivicay and descovy- doing well- we will do labs in 6 months- last labs were in OCT 2021 and looked good ( Vl < 20 and Cd4 was 638 Will see you back in 6 month

## 2021-02-22 NOTE — Progress Notes (Signed)
NAME: Janet Keller  DOB: 07/03/1941  MRN: [id number]  Date/Time: 02/22/2021 11:39 AM   Subjective:   ?follow up visit for HIV Last visit was in OCt 2021 AVRIELLE Keller is a 80 y.o female .with a history of HIV/AIDS, hypertension, hyperlipidemia, diabetes mellitus , MALT lymphoma Since her last visit , saw PCP ( Dec 2021 and then feb 2022) and then cardiologist ( Dr.Fath) for paliptations. Had holter and 2 d echo which were normal. EF > 55% and mild AR,TR and MR. Saw podiatrist on 01/27/21 for cutting to nails and check up She went to her PCP on 02/02/21 with epigastric pain and acid reflex and was asked to hold off aspirin containing product. He increased omeprazole to BID and ordered a CT abdomen which she says her insurance denied-H.pylori and lipase were normal.  pt does not remember going to PCP for the abdominal pain. She does not remember him telling her to stop aspirin. She has been having memory   100% adherent to HAARt- tivicay and Descovy Last VL was 20 and cd4 was 638 on 08/31/20 - 100% adherent to  Tivicay and Descovy since 09/17/18   Taken from last note  HIV was acquired through heterosexual transmission and she is not sure when she was exactly diagnosed.  She is unaware of a nadir CD4 count.  Visit with me on 09/17/2018 because of side effects.  She is doing well now her last labs have been from 10/10/2018 with viral load of less than 20 and CD4 more than 800.  Not aware of her prior regimens except Isentress and Combivir which she was on before changing to [facility] in January 2019.  She is to follow-up at [facility] and then with Dr. Clayborn Bigness and then Dr. Ola Spurr.  She was on Biktarvy which was changed to [place] plus Tivicay on 09/17/2018 because of stomach bloating and nervousness   Medical history Diabetes mellitus Hypertension Hyperlipidemia Fibroid uterus Malt lymphoma  Past surgical history Breast excisional biopsy on the left side  cataract extraction Tubal  ligation Hammertoe surgery                    Social history Past smoker. Used to drink alcohol in the past Now occasional wine Retired from [employer] Jan 2021 Lives on her own Has 3 boys and one girl. One son passed away due to diabetes  ?   Family History  Problem Relation Age of Onset  . Breast cancer Mother 60   No Known Allergies   Current meds Glipizide 5mg   Once a day Omeprazole Aspirin 81mg  Descovy  Tivicay Enalapril  10mg  X 2 20mg  a day Metoprol tartrate  25mg  Po BID Hydralazine 25mg  Po BID Lovastatin 40mg  at night  Hydrochlorthiazide 12.5 mg once a day Metformin 500mg   BID Calcium 600mg  + vitamin d3 800Iu BID Gabapentin ?  REVIEW OF SYSTEMS:  Const: negative fever, negative chills, negative weight loss Eyes: negative diplopia or visual changes, negative eye pain ENT: negative coryza, negative sore throat Resp: negative cough, hemoptysis, dyspnea Cards: negative for chest pain, palpitations, lower extremity edema GI- some hiccups, no pain, occasional heartburns, no nausea or vomiting or diarrhea GU: negative for frequency, dysuria and hematuria Skin: negative for rash and pruritus Heme: negative for easy bruising and gum/nose bleeding MS: back pain- better Numbness fingers Neurolo:poor short term memory, no headache or dizziness Psych: negative for feelings of anxiety, depression   Objective:  BP (!) 175/73   Pulse 69  Temp 98 F (36.7 C) (Oral)   Wt 141 lb (64 kg)   BMI 24.20 kg/m   PHYSICAL EXAM:  General: alert, oriented in time place and person  X5 Head: Normocephalic, without obvious abnormality, atraumatic. Eyes: Conjunctivae clear, anicteric sclerae. Pupils are equal Nose: Did not examine because of mask neck: Supple, symmetrical, no adenopathy, thyroid: non tender no carotid bruit and no JVD. Back: No CVA tenderness. Lungs: Clear to auscultation bilaterally. No Wheezing or Rhonchi. No rales. Heart: Regular rate and rhythm, no murmur,  rub or gallop. Abdomen: Soft, non-tender,not distended. Bowel sounds normal. No masses Extremities: Extremities normal, atraumatic, no cyanosis. No edema. No clubbing Skin: No rashes or lesions. Not Jaundiced Lymph: Cervical, supraclavicular normal. Neurologic: Grossly non-focal  Health maintenance  Vaccine Date last given comment  Influenza Aug 31 2020 Given at work  Hepatitis B    Hepatitis A antibody positive   Prevnar-PCV-13 02/06/2017   Pneumovac-PPSV-23 02/08/2011   TdaP 05/10/2012   HPV    Shingrix ( zoster vaccine)    Covid vaccine Pfizer                   2/16, 3/9 and 08/26/20 ______________________  Labs Lab Result  Date comment  HIV VL <15 Sep 2020   CD4 684 (23%) Oct 2021   Genotype     [id number]     HIV antibody     RPR NR 07/31/19   Quantiferon Gold Neg 02/26/20   Hep C ab NEg 07/31/19   Hepatitis B-ab,ag,c hepb s ab < 3.1 07/31/19   Hepatitis A-IgM, IgG /T positive 07/31/19   Lipid(TC,HDL,LDL,TG) 166/44/92/141 01/21/20   GC/CHL Neg 07/15/20   PAP  07/15/20   Creatinine  1.1 02/02/21     Preventive  Procedure Result  Date comment  colonoscopy     Mammogram Normal 12/15/19 Dense breast  tisue  Dental exam cleaning  2021   Opthal           Impression/Recommendation ? 80 year old female with history of HIV, hypertension and diabetes mellitus  HIV/AIDS .  Well controlled with undetectable viral load and CD4  Is 638 from 08/31/20 .  Currently on Descovy and Tivicay 100% adherent to it.  Drug interactions: With dolutegravir and metformin need to watch out for blood glucose and metformin cannot exceed be on thousand milligrams a day.  She is currently on 500 mg twice daily and she is not hypoglycemic. Also she is on calcium carbonate which will have to be taken 6 hours later or 2 hours before dolutegravir (tivicay) Which is what she is doing now.  Diabetes mellitus on metformin 500mg   twice daily and glipizide 5  mg.  Last  hemoglobin A1c from Dec 2021 is 6.5.  ? ?Hyperlipidemia on lovastatin: Last lipids are from 01/21/2020 with a cholesterol of 166, triglyceride of 141, HDL of 44 and LDL of 93.  Left EAR hearing loss- MRI IAC N done on 08/28/20-N  Hypertension on hydrochlorothiazide, hydralazine and enalapril.  t Last cr 1.1   Neuropathy-- she is not consistent with gabapentin- does PRN  Low back ache- much better- not taking meloxicam   ___________________________________________________ Discussed with patient in great detail.  .  We will see her in 6 months.

## 2021-03-01 ENCOUNTER — Ambulatory Visit: Payer: Medicare HMO | Admitting: Infectious Diseases

## 2021-03-17 ENCOUNTER — Other Ambulatory Visit: Payer: Self-pay

## 2021-03-17 ENCOUNTER — Telehealth: Payer: Self-pay

## 2021-03-17 DIAGNOSIS — B20 Human immunodeficiency virus [HIV] disease: Secondary | ICD-10-CM

## 2021-03-17 MED ORDER — TIVICAY 50 MG PO TABS: 50.0000 mg | ORAL_TABLET | Freq: Every day | ORAL | 6 refills | Status: DC

## 2021-03-17 MED ORDER — EMTRICITABINE-TENOFOVIR AF 200-25 MG PO TABS: 1.0000 | ORAL_TABLET | Freq: Every day | ORAL | 6 refills | Status: DC

## 2021-03-17 NOTE — Telephone Encounter (Signed)
Patient called for refills and has follow up in 07/2021. Sent refills per Adrian Prows.

## 2021-03-28 IMAGING — MG MM DIGITAL SCREENING BILAT W/ TOMO AND CAD
8 series · 8 of 24 positions shown · non-contrast
Comparison: Previous exam(s).

CLINICAL DATA: Screening.

EXAM:
DIGITAL SCREENING BILATERAL MAMMOGRAM WITH TOMOSYNTHESIS AND CAD
TECHNIQUE: Bilateral screening digital craniocaudal and mediolateral oblique
mammograms were obtained. Bilateral screening digital breast
tomosynthesis was performed. The images were evaluated with
computer-aided detection.

[L CC synth-2D]
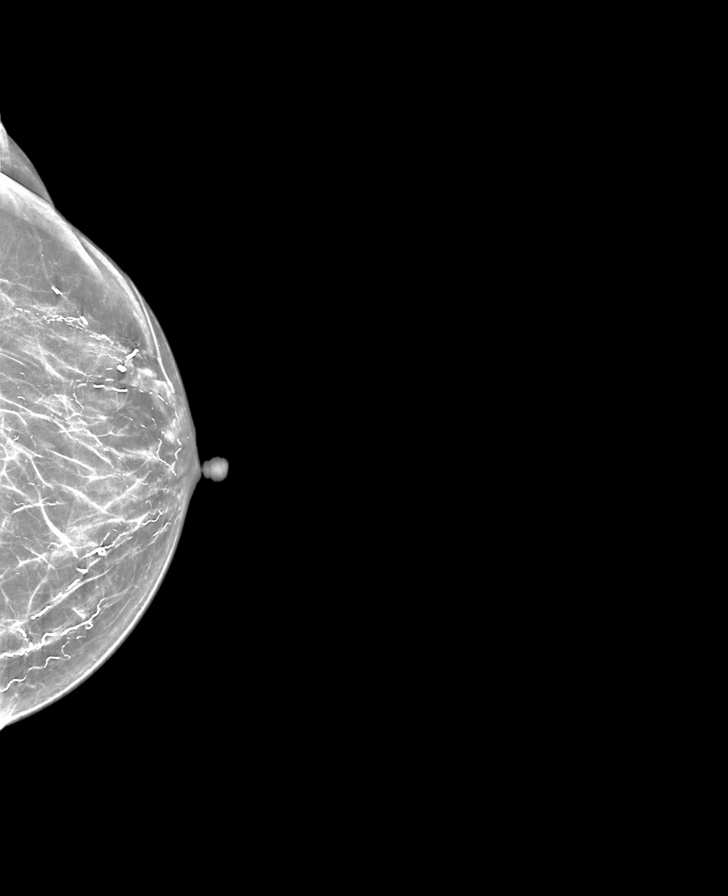

[R MLO synth-2D]
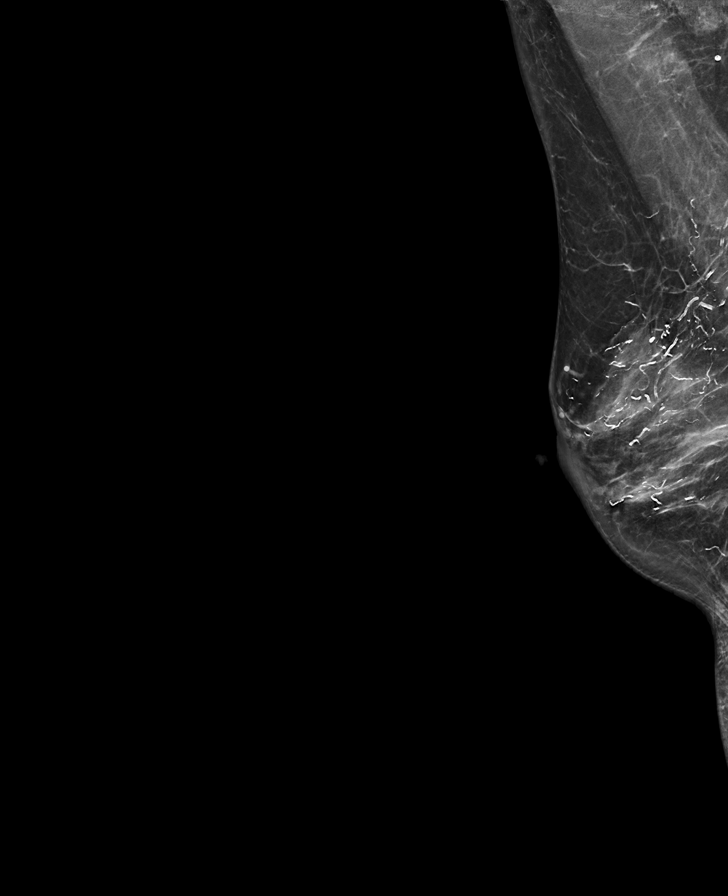

[L MLO synth-2D]
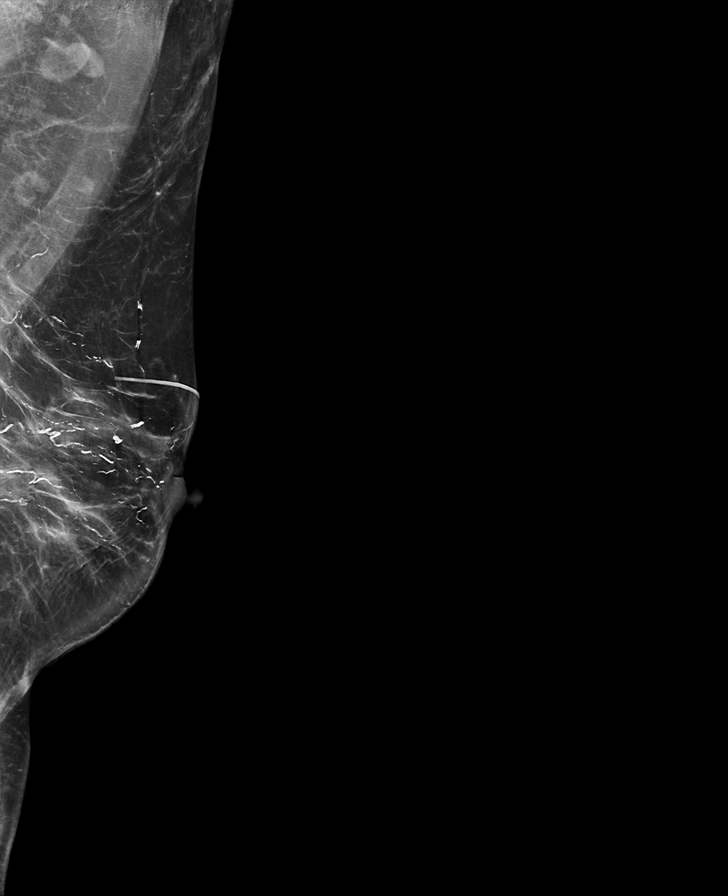

[R CC synth-2D]
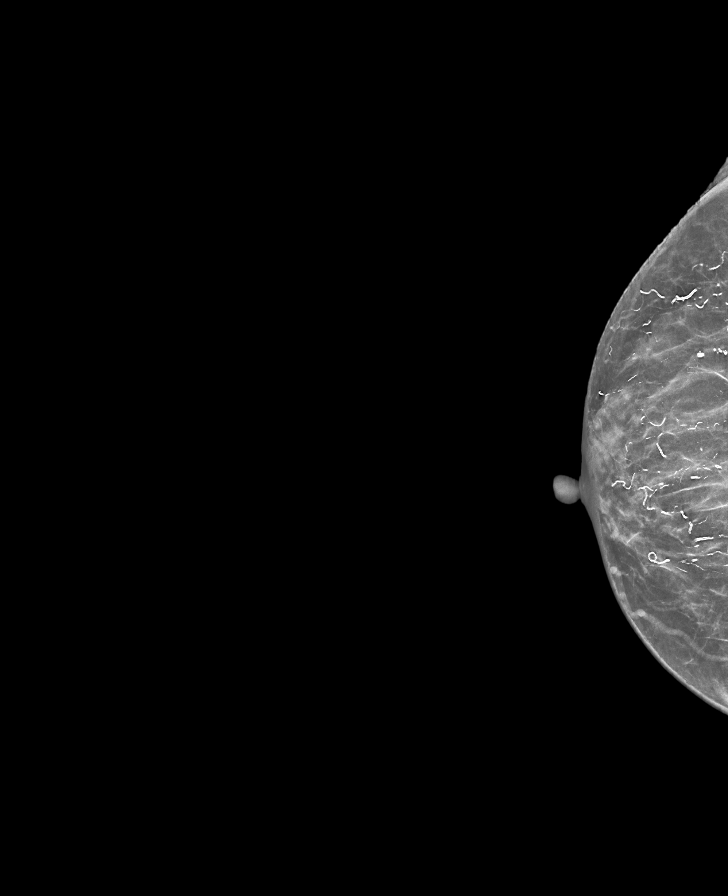

[R MLO tomo · tomo slice 35/70.0]
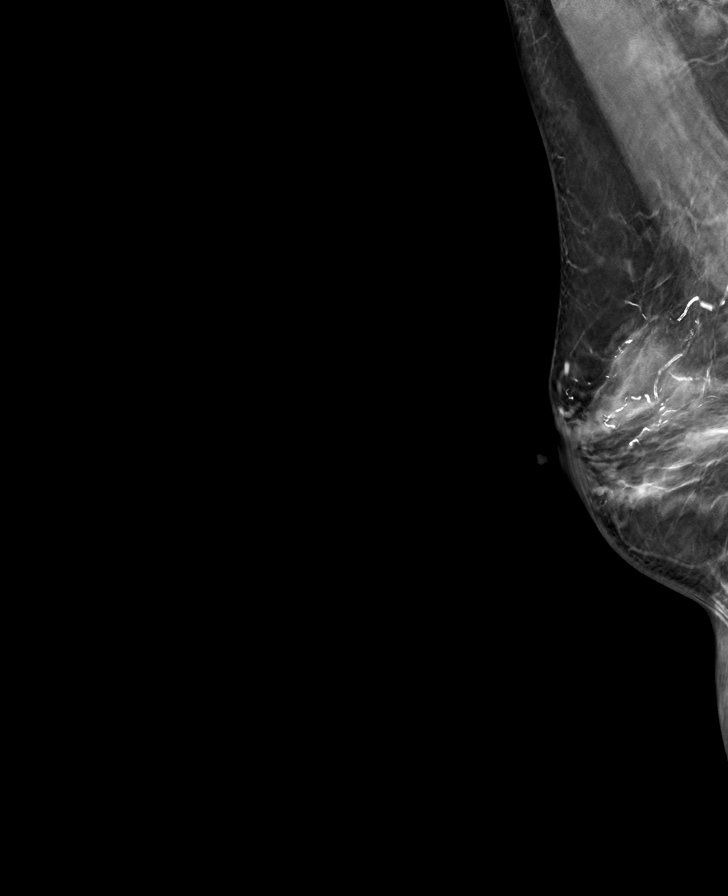

[L MLO tomo · tomo slice 38/75.0]
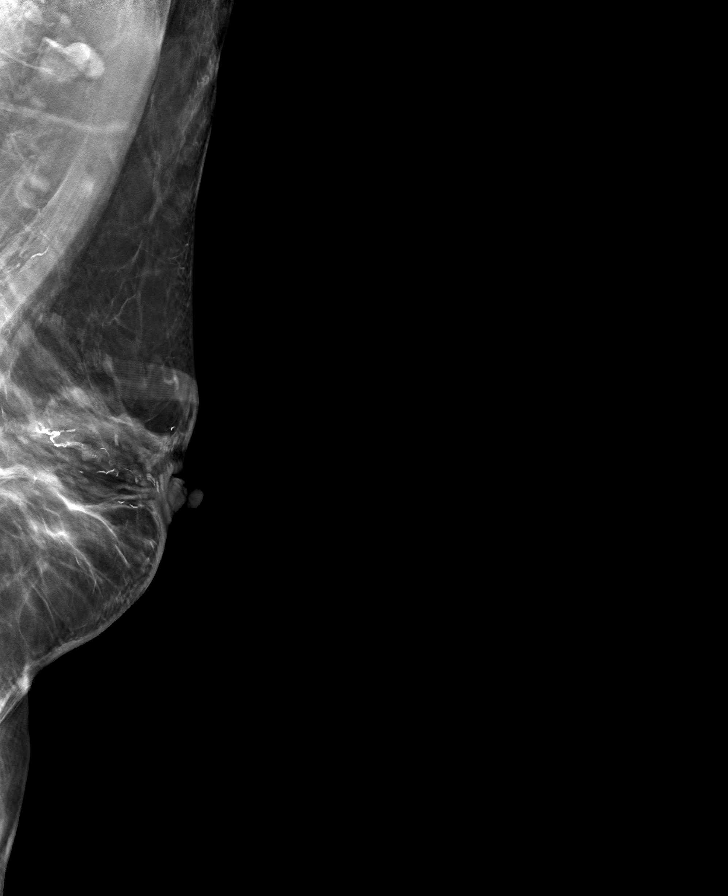

[L CC tomo · tomo slice 31/61.0]
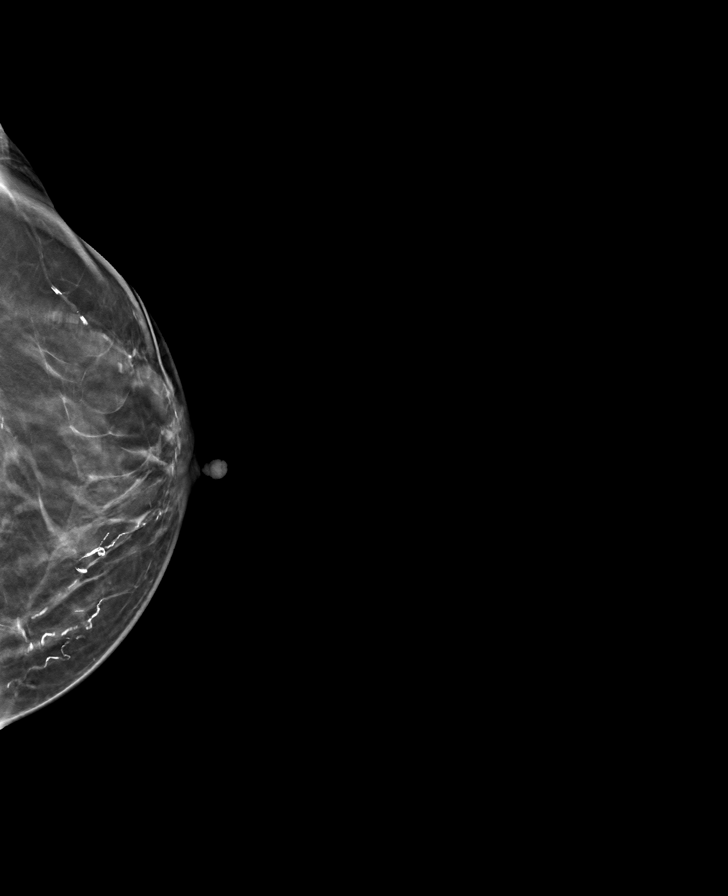

[R CC tomo · tomo slice 23/44.0]
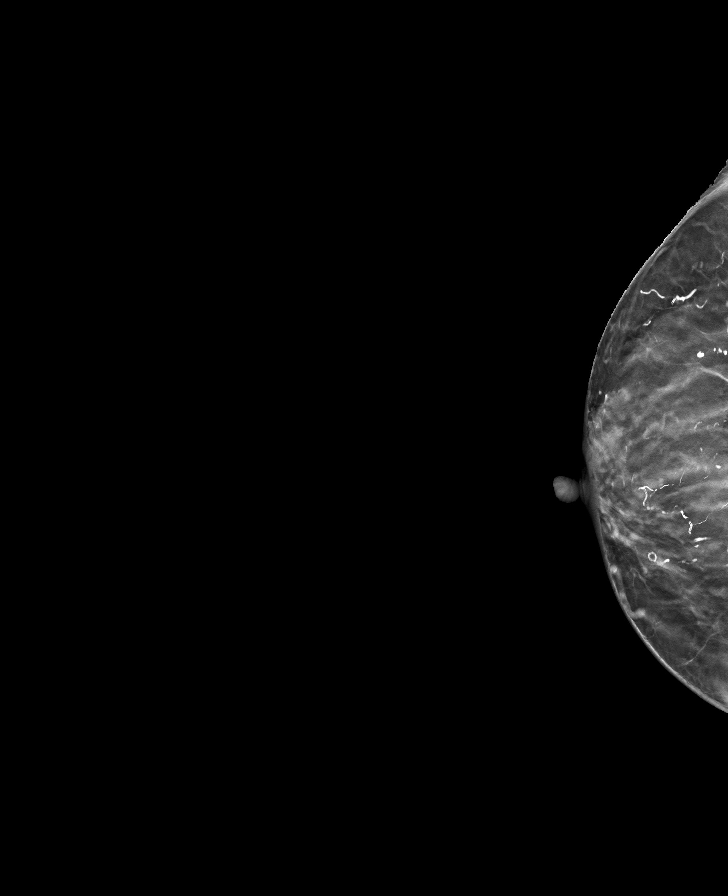

[8 of 24 positions shown; findings below may reference images not displayed]

ACR Breast Density Category c: The breast tissue is heterogeneously
dense, which may obscure small masses.
FINDINGS: There are no findings suspicious for malignancy. The images were
evaluated with computer-aided detection.
IMPRESSION: No mammographic evidence of malignancy. A result letter of this
screening mammogram will be mailed directly to the patient.

RECOMMENDATION:
Screening mammogram in one year. (Code:T4-5-GWO)

BI-RADS CATEGORY  1: Negative.

## 2021-03-31 ENCOUNTER — Telehealth (HOSPITAL_COMMUNITY): Payer: Self-pay

## 2021-03-31 ENCOUNTER — Other Ambulatory Visit: Payer: Self-pay | Admitting: Internal Medicine

## 2021-03-31 DIAGNOSIS — R1013 Epigastric pain: Secondary | ICD-10-CM

## 2021-04-05 ENCOUNTER — Ambulatory Visit: Payer: Medicare HMO

## 2021-05-10 ENCOUNTER — Emergency Department: Payer: Medicare HMO

## 2021-05-10 ENCOUNTER — Emergency Department
Admission: EM | Admit: 2021-05-10 | Discharge: 2021-05-10 | Disposition: A | Discharge status: Home / Self Care | Payer: Medicare HMO | Attending: Emergency Medicine | Admitting: Emergency Medicine

## 2021-05-10 ENCOUNTER — Other Ambulatory Visit: Payer: Self-pay

## 2021-05-10 DIAGNOSIS — S20211A Contusion of right front wall of thorax, initial encounter: Secondary | ICD-10-CM | POA: Diagnosis not present

## 2021-05-10 DIAGNOSIS — Z79899 Other long term (current) drug therapy: Secondary | ICD-10-CM | POA: Insufficient documentation

## 2021-05-10 DIAGNOSIS — R109 Unspecified abdominal pain: Secondary | ICD-10-CM | POA: Insufficient documentation

## 2021-05-10 DIAGNOSIS — Y92410 Unspecified street and highway as the place of occurrence of the external cause: Secondary | ICD-10-CM | POA: Diagnosis not present

## 2021-05-10 DIAGNOSIS — Z21 Asymptomatic human immunodeficiency virus [HIV] infection status: Secondary | ICD-10-CM | POA: Insufficient documentation

## 2021-05-10 DIAGNOSIS — S161XXA Strain of muscle, fascia and tendon at neck level, initial encounter: Secondary | ICD-10-CM

## 2021-05-10 DIAGNOSIS — I10 Essential (primary) hypertension: Secondary | ICD-10-CM | POA: Diagnosis not present

## 2021-05-10 DIAGNOSIS — Z7984 Long term (current) use of oral hypoglycemic drugs: Secondary | ICD-10-CM | POA: Diagnosis not present

## 2021-05-10 DIAGNOSIS — Z7982 Long term (current) use of aspirin: Secondary | ICD-10-CM | POA: Insufficient documentation

## 2021-05-10 DIAGNOSIS — Z87891 Personal history of nicotine dependence: Secondary | ICD-10-CM | POA: Diagnosis not present

## 2021-05-10 DIAGNOSIS — E119 Type 2 diabetes mellitus without complications: Secondary | ICD-10-CM | POA: Insufficient documentation

## 2021-05-10 DIAGNOSIS — T1490XA Injury, unspecified, initial encounter: Secondary | ICD-10-CM

## 2021-05-10 DIAGNOSIS — S199XXA Unspecified injury of neck, initial encounter: Secondary | ICD-10-CM | POA: Diagnosis present

## 2021-05-10 LAB — CBC
HCT: 35.9 % — ABNORMAL LOW (ref 36.0–46.0)
Hemoglobin: 12.4 g/dL (ref 12.0–15.0)
MCH: 32.1 pg (ref 26.0–34.0)
MCHC: 34.5 g/dL (ref 30.0–36.0)
MCV: 93 fL (ref 80.0–100.0)
Platelets: 288 10*3/uL (ref 150–400)
RBC: 3.86 MIL/uL — ABNORMAL LOW (ref 3.87–5.11)
RDW: 14.4 % (ref 11.5–15.5)
WBC: 7.8 10*3/uL (ref 4.0–10.5)
nRBC: 0 % (ref 0.0–0.2)

## 2021-05-10 LAB — BASIC METABOLIC PANEL
Anion gap: 11 (ref 5–15)
BUN: 15 mg/dL (ref 8–23)
CO2: 25 mmol/L (ref 22–32)
Calcium: 10 mg/dL (ref 8.9–10.3)
Chloride: 99 mmol/L (ref 98–111)
Creatinine, Ser: 0.99 mg/dL (ref 0.44–1.00)
GFR, Estimated: 58 mL/min — ABNORMAL LOW (ref >60)
Glucose, Bld: 100 mg/dL — ABNORMAL HIGH (ref 70–99)
Potassium: 3.9 mmol/L (ref 3.5–5.1)
Sodium: 135 mmol/L (ref 135–145)

## 2021-05-10 MED ORDER — ACETAMINOPHEN 500 MG PO TABS
1000.0000 mg | ORAL_TABLET | Freq: Once | ORAL | Status: AC
  Administered 2021-05-10: 1000 mg via ORAL
  Filled 2021-05-10: qty 2

## 2021-05-10 MED ORDER — TIZANIDINE HCL 2 MG PO TABS: 4.0000 mg | ORAL_TABLET | Freq: Every day | ORAL | 0 refills | Status: DC

## 2021-05-10 MED ORDER — IOHEXOL 300 MG/ML  SOLN
100.0000 mL | Freq: Once | INTRAMUSCULAR | Status: AC | PRN
  Administered 2021-05-10: 100 mL via INTRAVENOUS
  Filled 2021-05-10: qty 100
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
## 2021-05-10 NOTE — Discharge Instructions (Addendum)
Please take Tylenol as needed for pain.  Use tizanidine as needed for muscle tightness.  Return to the ER for any worsening symptoms or urgent changes in health.  Follow-up PCP in 1 week for recheck.

## 2021-08-12 ENCOUNTER — Ambulatory Visit
Admission: RE | Admit: 2021-08-12 | Discharge: 2021-08-12 | Disposition: A | Discharge status: Home / Self Care | Payer: Medicare HMO | Source: Ambulatory Visit | Attending: Physician Assistant | Admitting: Physician Assistant

## 2021-08-12 ENCOUNTER — Other Ambulatory Visit: Payer: Self-pay

## 2021-08-12 ENCOUNTER — Other Ambulatory Visit: Payer: Self-pay | Admitting: Physician Assistant

## 2021-08-12 DIAGNOSIS — M7989 Other specified soft tissue disorders: Secondary | ICD-10-CM | POA: Diagnosis not present

## 2021-08-23 ENCOUNTER — Emergency Department: Payer: Medicare HMO

## 2021-08-23 ENCOUNTER — Observation Stay: Payer: Medicare HMO

## 2021-08-23 ENCOUNTER — Observation Stay
Admission: EM | Admit: 2021-08-23 | Discharge: 2021-08-25 | Disposition: A | Discharge status: Home / Self Care | Payer: Medicare HMO | Attending: Emergency Medicine | Admitting: Emergency Medicine

## 2021-08-23 DIAGNOSIS — E119 Type 2 diabetes mellitus without complications: Secondary | ICD-10-CM | POA: Diagnosis not present

## 2021-08-23 DIAGNOSIS — Z87891 Personal history of nicotine dependence: Secondary | ICD-10-CM | POA: Diagnosis not present

## 2021-08-23 DIAGNOSIS — B20 Human immunodeficiency virus [HIV] disease: Secondary | ICD-10-CM | POA: Diagnosis not present

## 2021-08-23 DIAGNOSIS — R42 Dizziness and giddiness: Secondary | ICD-10-CM | POA: Diagnosis present

## 2021-08-23 DIAGNOSIS — E785 Hyperlipidemia, unspecified: Secondary | ICD-10-CM | POA: Diagnosis present

## 2021-08-23 DIAGNOSIS — Z7982 Long term (current) use of aspirin: Secondary | ICD-10-CM | POA: Insufficient documentation

## 2021-08-23 DIAGNOSIS — R4781 Slurred speech: Secondary | ICD-10-CM

## 2021-08-23 DIAGNOSIS — E871 Hypo-osmolality and hyponatremia: Secondary | ICD-10-CM | POA: Diagnosis present

## 2021-08-23 DIAGNOSIS — E860 Dehydration: Secondary | ICD-10-CM | POA: Diagnosis not present

## 2021-08-23 DIAGNOSIS — Z21 Asymptomatic human immunodeficiency virus [HIV] infection status: Secondary | ICD-10-CM | POA: Diagnosis present

## 2021-08-23 DIAGNOSIS — G459 Transient cerebral ischemic attack, unspecified: Secondary | ICD-10-CM

## 2021-08-23 DIAGNOSIS — I10 Essential (primary) hypertension: Secondary | ICD-10-CM | POA: Diagnosis not present

## 2021-08-23 DIAGNOSIS — Z79899 Other long term (current) drug therapy: Secondary | ICD-10-CM | POA: Insufficient documentation

## 2021-08-23 DIAGNOSIS — E782 Mixed hyperlipidemia: Secondary | ICD-10-CM | POA: Diagnosis not present

## 2021-08-23 DIAGNOSIS — Z20822 Contact with and (suspected) exposure to covid-19: Secondary | ICD-10-CM | POA: Insufficient documentation

## 2021-08-23 DIAGNOSIS — I639 Cerebral infarction, unspecified: Principal | ICD-10-CM | POA: Insufficient documentation

## 2021-08-23 DIAGNOSIS — Z7984 Long term (current) use of oral hypoglycemic drugs: Secondary | ICD-10-CM | POA: Insufficient documentation

## 2021-08-23 DIAGNOSIS — G372 Central pontine myelinolysis: Secondary | ICD-10-CM | POA: Diagnosis present

## 2021-08-23 LAB — CBC WITH DIFFERENTIAL/PLATELET
Abs Immature Granulocytes: 0.05 10*3/uL (ref 0.00–0.07)
Basophils Absolute: 0 10*3/uL (ref 0.0–0.1)
Basophils Relative: 0 %
Eosinophils Absolute: 0 10*3/uL (ref 0.0–0.5)
Eosinophils Relative: 0 %
HCT: 33.9 % — ABNORMAL LOW (ref 36.0–46.0)
Hemoglobin: 12.2 g/dL (ref 12.0–15.0)
Immature Granulocytes: 1 %
Lymphocytes Relative: 18 %
Lymphs Abs: 1.4 10*3/uL (ref 0.7–4.0)
MCH: 32.7 pg (ref 26.0–34.0)
MCHC: 36 g/dL (ref 30.0–36.0)
MCV: 90.9 fL (ref 80.0–100.0)
Monocytes Absolute: 0.3 10*3/uL (ref 0.1–1.0)
Monocytes Relative: 4 %
Neutro Abs: 6.1 10*3/uL (ref 1.7–7.7)
Neutrophils Relative %: 77 %
Platelets: 322 10*3/uL (ref 150–400)
RBC: 3.73 MIL/uL — ABNORMAL LOW (ref 3.87–5.11)
RDW: 14.1 % (ref 11.5–15.5)
WBC: 7.9 10*3/uL (ref 4.0–10.5)
nRBC: 0 % (ref 0.0–0.2)

## 2021-08-23 LAB — URINALYSIS, COMPLETE (UACMP) WITH MICROSCOPIC
Bacteria, UA: NONE SEEN
Bilirubin Urine: NEGATIVE
Glucose, UA: NEGATIVE mg/dL
Hgb urine dipstick: NEGATIVE
Ketones, ur: NEGATIVE mg/dL
Leukocytes,Ua: NEGATIVE
Nitrite: NEGATIVE
Protein, ur: NEGATIVE mg/dL
Specific Gravity, Urine: 1.006 (ref 1.005–1.030)
pH: 6 (ref 5.0–8.0)

## 2021-08-23 LAB — COMPREHENSIVE METABOLIC PANEL
ALT: 17 U/L (ref 0–44)
AST: 22 U/L (ref 15–41)
Albumin: 4.2 g/dL (ref 3.5–5.0)
Alkaline Phosphatase: 48 U/L (ref 38–126)
Anion gap: 11 (ref 5–15)
BUN: 20 mg/dL (ref 8–23)
CO2: 23 mmol/L (ref 22–32)
Calcium: 9.7 mg/dL (ref 8.9–10.3)
Chloride: 93 mmol/L — ABNORMAL LOW (ref 98–111)
Creatinine, Ser: 0.92 mg/dL (ref 0.44–1.00)
GFR, Estimated: >60 mL/min (ref >60)
Glucose, Bld: 153 mg/dL — ABNORMAL HIGH (ref 70–99)
Potassium: 4.1 mmol/L (ref 3.5–5.1)
Sodium: 127 mmol/L — ABNORMAL LOW (ref 135–145)
Total Bilirubin: 1.2 mg/dL (ref 0.3–1.2)
Total Protein: 7.7 g/dL (ref 6.5–8.1)

## 2021-08-23 LAB — APTT: aPTT: 28 seconds (ref 24–36)

## 2021-08-23 LAB — URINE DRUG SCREEN, QUALITATIVE (ARMC ONLY)
Amphetamines, Ur Screen: NOT DETECTED
Barbiturates, Ur Screen: NOT DETECTED
Benzodiazepine, Ur Scrn: NOT DETECTED
Cannabinoid 50 Ng, Ur ~~LOC~~: NOT DETECTED
Cocaine Metabolite,Ur ~~LOC~~: NOT DETECTED
MDMA (Ecstasy)Ur Screen: NOT DETECTED
Methadone Scn, Ur: NOT DETECTED
Opiate, Ur Screen: NOT DETECTED
Phencyclidine (PCP) Ur S: NOT DETECTED
Tricyclic, Ur Screen: NOT DETECTED

## 2021-08-23 LAB — PROTIME-INR
INR: 0.9 (ref 0.8–1.2)
Prothrombin Time: 12.2 seconds (ref 11.4–15.2)

## 2021-08-23 LAB — RESP PANEL BY RT-PCR (FLU A&B, COVID) ARPGX2
Influenza A by PCR: NEGATIVE
Influenza B by PCR: NEGATIVE
SARS Coronavirus 2 by RT PCR: NEGATIVE

## 2021-08-23 LAB — SODIUM, URINE, RANDOM: Sodium, Ur: 57 mmol/L

## 2021-08-23 MED ORDER — PRAVASTATIN SODIUM 20 MG PO TABS
40.0000 mg | ORAL_TABLET | Freq: Every day | ORAL | Status: DC
  Administered 2021-08-24: 40 mg via ORAL
  Filled 2021-08-23: qty 2

## 2021-08-23 MED ORDER — SODIUM CHLORIDE 0.9 % IV SOLN: INTRAVENOUS | Status: DC

## 2021-08-23 MED ORDER — STROKE: EARLY STAGES OF RECOVERY BOOK: Freq: Once | Status: AC

## 2021-08-23 MED ORDER — LACTATED RINGERS IV BOLUS
1000.0000 mL | Freq: Once | INTRAVENOUS | Status: AC
  Administered 2021-08-23: 1000 mL via INTRAVENOUS

## 2021-08-23 MED ORDER — ACETAMINOPHEN 160 MG/5ML PO SOLN
650.0000 mg | ORAL | Status: DC | PRN
  Filled 2021-08-23: qty 20.3

## 2021-08-23 MED ORDER — ASPIRIN EC 81 MG PO TBEC
81.0000 mg | DELAYED_RELEASE_TABLET | Freq: Every day | ORAL | Status: DC
  Administered 2021-08-23 – 2021-08-25 (×3): 81 mg via ORAL
  Filled 2021-08-23 (×3): qty 1

## 2021-08-23 MED ORDER — IOHEXOL 350 MG/ML SOLN
75.0000 mL | Freq: Once | INTRAVENOUS | Status: AC | PRN
  Administered 2021-08-23: 75 mL via INTRAVENOUS
  Filled 2021-08-23: qty 75

## 2021-08-23 MED ORDER — ACETAMINOPHEN 325 MG PO TABS
650.0000 mg | ORAL_TABLET | ORAL | Status: DC | PRN
  Administered 2021-08-24: 650 mg via ORAL
  Filled 2021-08-23: qty 2

## 2021-08-23 MED ORDER — ACETAMINOPHEN 650 MG RE SUPP: 650.0000 mg | RECTAL | Status: DC | PRN

## 2021-08-23 MED ORDER — DOLUTEGRAVIR SODIUM 50 MG PO TABS
50.0000 mg | ORAL_TABLET | Freq: Every day | ORAL | Status: DC
  Administered 2021-08-24 – 2021-08-25 (×2): 50 mg via ORAL
  Filled 2021-08-23 (×3): qty 1

## 2021-08-23 MED ORDER — LABETALOL HCL 5 MG/ML IV SOLN: 5.0000 mg | INTRAVENOUS | Status: DC | PRN

## 2021-08-23 MED ORDER — INSULIN ASPART 100 UNIT/ML IJ SOLN
0.0000 [IU] | Freq: Every day | INTRAMUSCULAR | Status: DC
Start: 2021-08-23 — End: 2021-08-25

## 2021-08-23 MED ORDER — METFORMIN HCL 500 MG PO TABS
500.0000 mg | ORAL_TABLET | Freq: Two times a day (BID) | ORAL | Status: DC
  Filled 2021-08-23: qty 1

## 2021-08-23 MED ORDER — INSULIN ASPART 100 UNIT/ML IJ SOLN
0.0000 [IU] | Freq: Three times a day (TID) | INTRAMUSCULAR | Status: DC
Start: 2021-08-24 — End: 2021-08-25
  Administered 2021-08-24 – 2021-08-25 (×2): 2 [IU] via SUBCUTANEOUS
  Administered 2021-08-25: 09:00:00 1 [IU] via SUBCUTANEOUS
  Filled 2021-08-23 (×3): qty 1

## 2021-08-23 MED ORDER — EMTRICITABINE-TENOFOVIR AF 200-25 MG PO TABS
1.0000 | ORAL_TABLET | Freq: Every day | ORAL | Status: DC
  Administered 2021-08-24 – 2021-08-25 (×2): 1 via ORAL
  Filled 2021-08-23 (×2): qty 1

## 2021-08-23 MED ORDER — PANTOPRAZOLE SODIUM 40 MG PO TBEC
80.0000 mg | DELAYED_RELEASE_TABLET | Freq: Every day | ORAL | Status: DC
  Administered 2021-08-24 – 2021-08-25 (×2): 80 mg via ORAL
  Filled 2021-08-23 (×2): qty 2

## 2021-08-23 NOTE — ED Notes (Signed)
Pt ambulatory to bathroom with 1 assist by writer and back to bed post void. Pt to be moved to [place] hallway 32.

## 2021-08-23 NOTE — ED Provider Notes (Signed)
[facility] Emergency Department Provider Note  ____________________________________________   Event Date/Time   First MD Initiated Contact with Patient 08/23/21 1845     (approximate)  I have reviewed the triage vital signs and the nursing notes.   HISTORY  Chief Complaint Dizziness   HPI Janet Keller is a 80 y.o. female past medical history of HIV, MALT lymphoma, HDL, HTN, DM2, valvular heart disease, lumbar stenosis and GERD who presents accompanied by son for assessment of 3 days of slurred speech associated with difficulty walking straight with patient stating she is feeling off kilter and will dizzy when she is trying to walk.  She denies any new headache, vision changes, dizziness when she is sitting still, chest pain, cough, shortness of breath, abdominal pain, nausea, vomiting, diarrhea, dysuria, rash or falls or injuries.  States he has some chronic fullness in right ear which he is currently taking some drops were prescribed by her ENT doctor.  No prior similar episodes.  Denies any recent EtOH use.  No history of CVA.  Per patient and son this occurred 3 days ago.         Past Medical History:  Diagnosis Date   Arthritis    osteoarthritis   Breast pain    Diabetes mellitus without complication (HCC)    GERD (gastroesophageal reflux disease)    HIV (human immunodeficiency virus infection) ([place])    Hyperlipidemia    Hypertension    MALT lymphoma ([place])     Patient Active Problem List   Diagnosis Date Noted   Lumbar stenosis without neurogenic claudication 04/06/2020   Acute bilateral low back pain with bilateral sciatica 03/19/2020   Hyperlipidemia, unspecified 08/01/2016   Diabetes mellitus ([place]) 05/27/2012   HIV (human immunodeficiency virus infection) ([place]) 02/02/2012   Benign essential hypertension 02/02/2012    Past Surgical History:  Procedure Laterality Date   BREAST BIOPSY Left 1985   neg   CATARACT EXTRACTION W/  INTRAOCULAR LENS  IMPLANT, BILATERAL     HAMMER TOE SURGERY Bilateral 02/01/2018   Procedure: HAMMER TOE CORRECTION/Bil 5TH toes;  Surgeon: Sharlotte Alamo, DPM;  Location: [facility] ORS;  Service: Podiatry;  Laterality: Bilateral;   HYSTEROSCOPY WITH D & C N/A 02/18/2016   Procedure: Fractional DILATATION AND CURETTAGE /HYSTEROSCOPY , Polypectomy and myomectomy;  Surgeon: Benjaman Kindler, MD;  Location: [facility] ORS;  Service: Gynecology;  Laterality: N/A;   TUBAL LIGATION      Prior to Admission medications   Medication Sig Start Date End Date Taking? Authorizing Provider  acetaminophen (TYLENOL) 500 MG tablet Take 500 mg by mouth every 6 (six) hours as needed.    [provider]  aspirin EC 81 MG tablet Take 81 mg by mouth daily.    [provider]  Calcium Carbonate (CALCIUM-CARB 600 PO) Take 2 tablets by mouth daily.    [provider]  clotrimazole-betamethasone (LOTRISONE) cream Apply topically 2 (two) times daily. 08/12/20   [provider]  dolutegravir (TIVICAY) 50 MG tablet Take 1 tablet (50 mg total) by mouth daily. 03/17/21   Leonel Ramsay, MD  emtricitabine-tenofovir AF (DESCOVY) 200-25 MG tablet Take 1 tablet by mouth daily. 03/17/21   Leonel Ramsay, MD  enalapril (VASOTEC) 10 MG tablet Take 2 tablets by mouth 2 (two) times daily. 07/02/20   [provider]  estradiol (ESTRACE) 0.1 MG/GM vaginal cream Place 0.5 application vaginally once a week. 09/14/17   [provider]  Fluocinolone Acetonide 0.01 % OIL Place  in ear(s).    [provider]  fluticasone (FLONASE) 50 MCG/ACT nasal spray Place into the nose. 07/28/20 07/28/21  [provider]  gabapentin (NEURONTIN) 100 MG capsule Take 100 mg by mouth 3 (three) times daily. 03/02/20   [provider]  glipiZIDE (GLUCOTROL) 5 MG tablet Take 5 mg by mouth daily before breakfast.    [provider]  hydrALAZINE (APRESOLINE) 25 MG tablet Take 1 tablet by mouth  2 (two) times daily. 05/10/20   [provider]  hydrochlorothiazide (HYDRODIURIL) 12.5 MG tablet Take 1 tablet by mouth daily. 12/24/18   [provider]  lovastatin (MEVACOR) 40 MG tablet Take 40 mg by mouth at bedtime.     [provider]  metFORMIN (GLUCOPHAGE) 500 MG tablet Take 1 tablet by mouth 2 (two) times daily with a meal. 03/19/20   [provider]  metoprolol tartrate (LOPRESSOR) 25 MG tablet Take 25 mg by mouth 2 (two) times daily.  03/29/18   [provider]  Multiple Vitamins-Minerals (MULTIVITAMIN ADULT PO) Take 1 tablet by mouth daily.    [provider]  nystatin ointment (MYCOSTATIN) Apply 1 application topically 2 (two) times daily as needed (skin irritation).     [provider]  omeprazole (PRILOSEC) 40 MG capsule Take 1 tablet by mouth daily. 04/07/20   [provider]  Polyethyl Glycol-Propyl Glycol (SYSTANE) 0.4-0.3 % SOLN Apply to eye.    [provider]  tiZANidine (ZANAFLEX) 2 MG tablet Take 2 tablets (4 mg total) by mouth at bedtime. 05/10/21   Duanne Guess, PA-C    Allergies Patient has no known allergies.  Family History  Problem Relation Age of Onset   Breast cancer Mother 40    Social History Social History   Tobacco Use   Smoking status: Former   Smokeless tobacco: Never  Scientific laboratory technician Use: Never used  Substance Use Topics   Alcohol use: Yes    Alcohol/week: 1.0 standard drink    Types: 1 Glasses of wine per week    Comment: previous ETOH abuse   Drug use: No    Review of Systems  Review of Systems  Constitutional:  Negative for chills and fever.  HENT:  Negative for sore throat.   Eyes:  Negative for pain.  Respiratory:  Negative for cough and stridor.   Cardiovascular:  Negative for chest pain.  Gastrointestinal:  Negative for vomiting.  Genitourinary:  Negative for dysuria.  Musculoskeletal:  Negative for myalgias.  Skin:  Negative for rash.   Neurological:  Positive for dizziness and speech change. Negative for seizures, loss of consciousness and headaches.       Slurred speech, difficulty walking   Psychiatric/Behavioral:  Negative for suicidal ideas.   All other systems reviewed and are negative.    ____________________________________________   PHYSICAL EXAM:  VITAL SIGNS: ED Triage Vitals  Enc Vitals Group     BP 08/23/21 1521 139/77     Pulse Rate 08/23/21 1521 72     Resp 08/23/21 1521 16     Temp 08/23/21 1521 98.4 F (36.9 C)     Temp Source 08/23/21 1521 Oral     SpO2 08/23/21 1521 98 %     Weight --      Height --      Head Circumference --      Peak Flow --      Pain Score 08/23/21 1557 0     Pain Loc --  Pain Edu? --      Excl. in [place]? --    Vitals:   08/23/21 2000 08/23/21 2020  BP: (!) 154/84   Pulse: 63 65  Resp: 17   Temp:    SpO2: 99% 99%   Physical Exam Vitals and nursing note reviewed.  Constitutional:      General: She is not in acute distress.    Appearance: She is well-developed.  HENT:     Head: Normocephalic and atraumatic.     Right Ear: External ear normal.     Left Ear: External ear normal.     Nose: Nose normal.  Eyes:     Conjunctiva/sclera: Conjunctivae normal.  Cardiovascular:     Rate and Rhythm: Normal rate and regular rhythm.     Heart sounds: No murmur heard. Pulmonary:     Effort: Pulmonary effort is normal. No respiratory distress.     Breath sounds: Normal breath sounds.  Abdominal:     Palpations: Abdomen is soft.     Tenderness: There is no abdominal tenderness.  Musculoskeletal:     Cervical back: Neck supple.  Skin:    General: Skin is warm and dry.  Neurological:     Mental Status: She is alert.     Cranial Nerves: Dysarthria present.    With exception of slurred speech cranial nerves II through XII are grossly intact.  She is symmetric strength in her upper and lower extremities.  Sensation is intact to light touch lower extremities.  There  is no pronator drift or finger dysmetria.  Patient is unable to ambulate with steady gait unassisted.  Patient is oriented x4. ____________________________________________   LABS (all labs ordered are listed, but only abnormal results are displayed)  Labs Reviewed  CBC WITH DIFFERENTIAL/PLATELET - Abnormal; Notable for the following components:      Result Value   RBC 3.73 (*)    HCT 33.9 (*)    All other components within normal limits  COMPREHENSIVE METABOLIC PANEL - Abnormal; Notable for the following components:   Sodium 127 (*)    Chloride 93 (*)    Glucose, Bld 153 (*)    All other components within normal limits  URINALYSIS, COMPLETE (UACMP) WITH MICROSCOPIC - Abnormal; Notable for the following components:   Color, Urine STRAW (*)    APPearance CLEAR (*)    All other components within normal limits  RESP PANEL BY RT-PCR (FLU A&B, COVID) ARPGX2  APTT  PROTIME-INR  OSMOLALITY  SODIUM, URINE, RANDOM   ____________________________________________  EKG  ECG shows sinus rhythm with a ventricular rate of 61 and some artifact versus nonspecific change in lead II and lead III without any other clear evidence of acute ischemia or significant arrhythmia.  Unremarkable intervals. ____________________________________________  RADIOLOGY  ED MD interpretation: CT head without contrast shows no evidence of subacute CVA, hemorrhage or other clear acute process.  There is some opacification of the right mastoid air cells and middle ear cavity.  Official radiology report(s): CT HEAD WO CONTRAST (5MM)  Result Date: 08/23/2021 CLINICAL DATA:  Dizziness, slurred speech, imbalance, symptoms for 3 days, worsened slurred speech EXAM: CT HEAD WITHOUT CONTRAST TECHNIQUE: Contiguous axial images were obtained from the base of the skull through the vertex without intravenous contrast. Sagittal and coronal MPR images reconstructed from axial data set. COMPARISON:  MR brain 08/28/2020 FINDINGS:  Brain: Normal ventricular morphology. No midline shift or mass effect. Minimal small vessel chronic ischemic changes of deep cerebral white matter. No intracranial  hemorrhage, mass lesion, or evidence of acute infarction. No extra-axial fluid collections. Vascular: Atherosclerotic calcification of internal carotid arteries at skull base. Skull: Intact. Mild hyperostosis frontalis interna. Extensive pannus surrounding the odontoid process Sinuses/Orbits: Opacified RIGHT mastoid air cells and middle ear cavity. Other: N/A IMPRESSION: Minimal small vessel chronic ischemic changes of deep cerebral white matter. No acute intracranial abnormalities. Opacified RIGHT mastoid air cells and middle ear cavity. Electronically Signed   By: Lavonia Dana M.D.   On: 08/23/2021 16:47    ____________________________________________   PROCEDURES  Procedure(s) performed (including Critical Care):  Procedures   ____________________________________________   INITIAL IMPRESSION / ASSESSMENT AND PLAN / ED COURSE        Patient presents with above-stated exam for assessment of 3 days of slurred speech associate with some dizziness and inability ambulate normally.  On arrival patient is afebrile he medically stable.  She does have some slurred speech on exam and is unable to ambulate unassisted with steady gait which per patient's son is new starting 3 days ago.  No other clear acute focal deficits.  She denies any other associated symptoms.  Differential includes CVA, intracranial lymphoma, metabolic derangements with lower suspicion for peripheral vertigo this is likely Patient slurred speech.  CT head without contrast is unremarkable.  ECG shows no significant arrhythmia or clear evidence of ischemia.  CBC shows no leukocytosis or acute anemia.  CMP shows a sodium of 127 without any other significant derangements.  UA shows no evidence of infection or blood.  Concerned of possible subacute CVA versus symptomatic  hyponatremia versus other unclear etiologies at this time although I do not believe patient is currently safe to go back to her house tonight as she currently cannot ambulate without assistance and lives alone for her son who is in the ED with her at bedside.  I  will obtain MRI brain as well as vessel imaging of the head and neck to assess for possible subacute CVA and plan to gently hydrate emergency room and admit to hospital service for observation overnight.   ____________________________________________   FINAL CLINICAL IMPRESSION(S) / ED DIAGNOSES  Final diagnoses:  Dizziness  Slurred speech  Dehydration  Hyponatremia    Medications  lactated ringers bolus 1,000 mL (1,000 mLs Intravenous New Bag/Given 08/23/21 1918)     ED Discharge Orders     None        Note:  This document was prepared using Dragon voice recognition software and may include unintentional dictation errors.    Lucrezia Starch, MD 08/23/21 2135

## 2021-08-23 NOTE — ED Notes (Signed)
Pt given lunch box per her request.
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
## 2021-08-23 NOTE — ED Notes (Signed)
Pt assisted to toilet- taken to MRI by tech at this time.

## 2021-08-23 NOTE — ED Provider Notes (Signed)
Emergency Medicine Provider Triage Evaluation Note  Janet Keller, a 80 y.o. female  was evaluated in triage.  Pt complains of 3 days of slurred speech and dizziness. Patient with history of HIV, DM, HTN presents with UE weakness and dizziness. She is not on blood thinner and arrives with her son who endorses change from baseline.  Review of Systems  Positive: Slurred speech Negative: Head injury, syncope  Physical Exam  BP 139/77 (BP Location: Left Arm)   Pulse 72   Temp 98.4 F (36.9 C) (Oral)   Resp 16   SpO2 98%  Gen:   Awake, no distress  NAD slurred speech Resp:  Normal effort CTA MSK:   Moves extremities without difficulty Normal grip bilaterally Other:  Neuro: CNII-XII grossly intact. No facial droop  Medical Decision Making  Medically screening exam initiated at 4:09 PM.  Appropriate orders placed.  Janet Keller was informed that the remainder of the evaluation will be completed by another provider, this initial triage assessment does not replace that evaluation, and the importance of remaining in the ED until their evaluation is complete.  Patient with ED evaluation of 3-day complaint of slurred speech and dizziness.    Melvenia Needles, PA-C 08/23/21 1614    Delman Kitten, MD 08/23/21 2236

## 2021-08-23 NOTE — ED Notes (Signed)
Admitting DO at bedside

## 2021-08-23 NOTE — ED Triage Notes (Signed)
Pt presents to ED with son who has been having slurred speech and dizziness for the past 3 days and son states today the slurred speech has got worse. Pt is A&Ox4 and states she does feel off balance. Pt denies any falls or recent head trauma. Son denies pt is staking blood thinners at this time.

## 2021-08-23 NOTE — ED Notes (Signed)
Handoff given to [employer]

## 2021-08-23 NOTE — ED Notes (Signed)
CT notified of 20 gauge IV placement.

## 2021-08-24 ENCOUNTER — Encounter: Payer: Self-pay | Admitting: Internal Medicine

## 2021-08-24 ENCOUNTER — Observation Stay: Payer: Medicare HMO

## 2021-08-24 ENCOUNTER — Other Ambulatory Visit: Payer: Self-pay

## 2021-08-24 DIAGNOSIS — R4182 Altered mental status, unspecified: Secondary | ICD-10-CM | POA: Diagnosis not present

## 2021-08-24 DIAGNOSIS — G372 Central pontine myelinolysis: Secondary | ICD-10-CM | POA: Diagnosis not present

## 2021-08-24 DIAGNOSIS — B20 Human immunodeficiency virus [HIV] disease: Secondary | ICD-10-CM | POA: Diagnosis not present

## 2021-08-24 DIAGNOSIS — E871 Hypo-osmolality and hyponatremia: Secondary | ICD-10-CM | POA: Diagnosis not present

## 2021-08-24 LAB — BASIC METABOLIC PANEL
Anion gap: 9 (ref 5–15)
BUN: 15 mg/dL (ref 8–23)
CO2: 25 mmol/L (ref 22–32)
Calcium: 10 mg/dL (ref 8.9–10.3)
Chloride: 99 mmol/L (ref 98–111)
Creatinine, Ser: 0.93 mg/dL (ref 0.44–1.00)
GFR, Estimated: >60 mL/min (ref >60)
Glucose, Bld: 125 mg/dL — ABNORMAL HIGH (ref 70–99)
Potassium: 4 mmol/L (ref 3.5–5.1)
Sodium: 133 mmol/L — ABNORMAL LOW (ref 135–145)

## 2021-08-24 LAB — LIPID PANEL
Cholesterol: 188 mg/dL (ref 0–200)
HDL: 47 mg/dL (ref >40)
LDL Cholesterol: 99 mg/dL (ref 0–99)
Total CHOL/HDL Ratio: 4 RATIO
Triglycerides: 208 mg/dL — ABNORMAL HIGH (ref <150)
VLDL: 42 mg/dL — ABNORMAL HIGH (ref 0–40)

## 2021-08-24 LAB — CBG MONITORING, ED: Glucose-Capillary: 173 mg/dL — ABNORMAL HIGH (ref 70–99)

## 2021-08-24 LAB — FOLATE: Folate: 42 ng/mL (ref >5.9)

## 2021-08-24 LAB — CBC
HCT: 36.2 % (ref 36.0–46.0)
Hemoglobin: 12.7 g/dL (ref 12.0–15.0)
MCH: 32.9 pg (ref 26.0–34.0)
MCHC: 35.1 g/dL (ref 30.0–36.0)
MCV: 93.8 fL (ref 80.0–100.0)
Platelets: 276 10*3/uL (ref 150–400)
RBC: 3.86 MIL/uL — ABNORMAL LOW (ref 3.87–5.11)
RDW: 14.5 % (ref 11.5–15.5)
WBC: 10.5 10*3/uL (ref 4.0–10.5)
nRBC: 0 % (ref 0.0–0.2)

## 2021-08-24 LAB — GAMMA GT: GGT: 34 U/L (ref 7–50)

## 2021-08-24 LAB — HEMOGLOBIN A1C
Hgb A1c MFr Bld: 6.7 % — ABNORMAL HIGH (ref 4.8–5.6)
Mean Plasma Glucose: 146 mg/dL

## 2021-08-24 LAB — VITAMIN B12: Vitamin B-12: 331 pg/mL (ref 180–914)

## 2021-08-24 LAB — TSH: TSH: 1.131 u[IU]/mL (ref 0.350–4.500)

## 2021-08-24 LAB — OSMOLALITY: Osmolality: 284 mOsm/kg (ref 275–295)

## 2021-08-24 LAB — GLUCOSE, CAPILLARY: Glucose-Capillary: 170 mg/dL — ABNORMAL HIGH (ref 70–99)

## 2021-08-24 MED ORDER — GADOBUTROL 1 MMOL/ML IV SOLN
6.0000 mL | Freq: Once | INTRAVENOUS | Status: AC | PRN
  Administered 2021-08-24: 6 mL via INTRAVENOUS
  Filled 2021-08-24: qty 6

## 2021-08-24 NOTE — Assessment & Plan Note (Signed)
-  Hold enalapril, hydralazine, HCTZ, metop

## 2021-08-24 NOTE — ED Notes (Signed)
CBG - 173 
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
## 2021-08-24 NOTE — Progress Notes (Signed)
Progress Note    Janet Keller   JJH:[id number]  DOB: 03-Jul-1941  DOA: 08/23/2021     0 Date of Service: 08/24/2021     Subjective:  No change in slurred speech or ataxia.  No new LOC, no confusion.  No fever, no headache, chest pain, dyspnea.  Hospital Problems * Central pontine myelinolysis [facility]) MRI brain overnight showed ODS.  Unclear trigger.  - Consult Neurology - Consult ID re: ?PML, medicaiton effect causing ODS?  - Stop fluids  Hyponatremia Mild, asymptomatic.  Baseline appears to be around 135.  No recent hospitalizaiton, no recent history of severe hyponatremia.  I do not believe that a sodium of 127-128 could have led to ODS, nor that fluids overnight were related (clearly this ODS occurred 2-3 days prior to admission when her symptoms started) - Trend BMP  HIV (human immunodeficiency virus infection) ([place]) - COnsult ID - Continue home ART  Benign essential hypertension -Hold enalapril, hydralazine, HCTZ, metop  Hyperlipidemia, unspecified -Continue pravastatin  Diabetes mellitus ([place]) Glucose normal -Continue SS corrections -Continue metformin     Objective Vital signs were reviewed and unremarkable except for elevated BP.  Vitals:   08/24/21 0400 08/24/21 0630 08/24/21 1105 08/24/21 1649  BP: 140/63 (!) 162/81 (!) 160/82 (!) 167/79  Pulse: (!) 53 (!) 55 (!) 58 68  Resp: 16 16 15 14   Temp:   98.4 F (36.9 C)   TempSrc:   Oral   SpO2: 99% 99% 98% 97%      Exam Physical Exam Constitutional:      General: She is not in acute distress. HENT:     Nose: No nasal deformity or rhinorrhea.     Mouth/Throat:     Lips: Pink. No lesions.     Mouth: Mucous membranes are moist. No oral lesions.     Dentition: Normal dentition.     Pharynx: Oropharynx is clear. No posterior oropharyngeal erythema.  Eyes:     General: Lids are normal. Gaze aligned appropriately.     Extraocular Movements: Extraocular movements intact.     Conjunctiva/sclera:  Conjunctivae normal.  Cardiovascular:     Rate and Rhythm: Normal rate and regular rhythm.     Pulses:          Radial pulses are 2+ on the right side and 2+ on the left side.     Heart sounds: Normal heart sounds, S1 normal and S2 normal. No murmur heard. Pulmonary:     Effort: Pulmonary effort is normal. No respiratory distress.     Breath sounds: No wheezing or rales.  Abdominal:     General: There is no distension.     Palpations: Abdomen is soft.     Tenderness: There is no abdominal tenderness. There is no guarding or rebound.  Musculoskeletal:     Right lower leg: No edema.     Left lower leg: No edema.  Skin:    General: Skin is warm and dry.     Findings: No lesion or rash.  Neurological:     Mental Status: She is alert and oriented to person, place, and time.     Cranial Nerves: Cranial nerves are intact.     Motor: No weakness.     Coordination: Coordination abnormal.     Comments: Speech somewhat slurred  Psychiatric:        Attention and Perception: Attention normal.        Mood and Affect: Mood and affect normal.  Behavior: Behavior is cooperative.        Cognition and Memory: Memory normal.        Judgment: Judgment normal.       Labs / Other Information My review of labs, imaging, notes and other tests is significant for mild hyponatremia, MRI brain showing pontine enhancement, across midline, MRA and CTA show occluded vertebral artery     Time spent: 35 minutes [facility] 08/24/2021, 5:21 PM

## 2021-08-24 NOTE — Consult Note (Signed)
NAME: Janet Keller  DOB: 1941/10/01  MRN: [id number]  Date/Time: 08/24/2021 12:15 PM  REQUESTING PROVIDER: Dr.Danford Subjective:  REASON FOR CONSULT: pontine myelinolysis ?pt well known to me from the treatment of HIV . History from patient and chart reviewed Janet Keller is a 80 y.o. 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  Opacified RIGHT mastoid air cells and middle ear cavity MRI brain showed Diffusion abnormality of the midline pons. It is atypical of pontine ischemia to cross the midline. This could indicate osmotic demyelination. 2. No intracranial large vessel occlusion. 3. Loss of flow related enhancement within the right vertebral artery may indicate occlusion or high-grade stenosis. 4. Loss of enhancement within the proximal left internal carotid artery may indicate internal thrombus or high-grade stenosis. CTA of the neck might be helpful for further characterization. 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  Pt says she was at her baseline until a few days ago when she felt herself staggering- She did not have any falls, she was able to do her chores inspite of that. She did not have any fever , no nausea or vomiting,  Occasional alcohol ( used to drink more in the past) Past h/o smoking-  No drugs Pt diagnosed with HIV early 90s- she is 100% adherent to HAARt and well controlled Past Medical History:  Diagnosis Date   Arthritis    osteoarthritis   Breast pain     Diabetes mellitus without complication (HCC)    GERD (gastroesophageal reflux disease)    HIV (human immunodeficiency virus infection) ([place])    Hyperlipidemia    Hypertension    MALT lymphoma ([place])    Fibroid uterus Past Surgical History:  Procedure Laterality Date   BREAST BIOPSY Left 1985   neg   CATARACT EXTRACTION W/ INTRAOCULAR LENS  IMPLANT, BILATERAL     HAMMER TOE SURGERY Bilateral 02/01/2018   Procedure: HAMMER TOE CORRECTION/Bil 5TH toes;  Surgeon: Sharlotte Alamo, DPM;  Location: [facility] OR[facility];  Service: Podiatry;  Laterality: Bilateral;   HYSTEROSCOPY WITH D & C N/A 02/18/2016   Procedure: Fractional DILATATION AND CURETTAGE /HYSTEROSCOPY , Polypectomy and myomectomy;  Surgeon: Benjaman Kindler, MD;  Location: [facility] OR[facility];  Service: Gynecology;  Laterality: N/A;   TUBAL LIGATION      Social History   Socioeconomic History   Marital status: Single    Spouse name: Not on file   Number of children: Not on file   Years of education: Not on file   Highest education level: Not on file  Occupational History   Not on file  Tobacco Use   Smoking status: Former   Smokeless tobacco: Never  Vaping Use   Vaping Use: Never used  Substance and Sexual Activity   Alcohol use: Yes    Alcohol/week: 1.0 standard drink    Types: 1 Glasses of wine per week    Comment: previous ETOH abuse   Drug use: No   Sexual activity: Not  on file  Other Topics Concern   Not on file  Social History Narrative   Not on file   Social Determinants of Health   Financial Resource Strain: Not on file  Food Insecurity: Not on file  Transportation Needs: Not on file  Physical Activity: Not on file  Stress: Not on file  Social Connections: Not on file  Intimate Partner Violence: Not on file    Family History  Problem Relation Age of Onset   Breast cancer Mother 63   No Known Allergies I? Current Facility-Administered Medications  Medication Dose Route Frequency Provider Last Rate Last Admin    acetaminophen (TYLENOL) tablet 650 mg  650 mg Oral Q4H PRN Cox, Amy N, DO       Or   acetaminophen (TYLENOL) 160 MG/5ML solution 650 mg  650 mg Per Tube Q4H PRN Cox, Amy N, DO       Or   acetaminophen (TYLENOL) suppository 650 mg  650 mg Rectal Q4H PRN Cox, Amy N, DO       aspirin EC tablet 81 mg  81 mg Oral Daily Cox, Amy N, DO   81 mg at 08/24/21 1127   dolutegravir (TIVICAY) tablet 50 mg  50 mg Oral Daily Cox, Amy N, DO   50 mg at 08/24/21 1127   emtricitabine-tenofovir AF (DESCOVY) 200-25 MG per tablet 1 tablet  1 tablet Oral Daily Cox, Amy N, DO   1 tablet at 08/24/21 1127   insulin aspart (novoLOG) injection 0-5 Units  0-5 Units Subcutaneous QHS Cox, Amy N, DO       insulin aspart (novoLOG) injection 0-9 Units  0-9 Units Subcutaneous TID WC Cox, Amy N, DO       labetalol (NORMODYNE) injection 5 mg  5 mg Intravenous Q2H PRN Cox, Amy N, DO       [START ON 08/26/2021] metFORMIN (GLUCOPHAGE) tablet 500 mg  500 mg Oral BID WC Cox, Amy N, DO       pantoprazole (PROTONIX) EC tablet 80 mg  80 mg Oral Daily Cox, Amy N, DO   80 mg at 08/24/21 1127   pravastatin (PRAVACHOL) tablet 40 mg  40 mg Oral q1800 Cox, Amy N, DO       Current Outpatient Medications  Medication Sig Dispense Refill   acetaminophen (TYLENOL) 500 MG tablet Take 500 mg by mouth every 6 (six) hours as needed.     acetic acid-hydrocortisone (VOSOL-HC) OTIC solution Place 4 drops in ear(s) 2 (two) times a week.     aspirin EC 81 MG tablet Take 81 mg by mouth daily.     Calcium Carbonate (CALCIUM-CARB 600 PO) Take 2 tablets by mouth daily.     clotrimazole-betamethasone (LOTRISONE) cream Apply topically 2 (two) times daily.     dolutegravir (TIVICAY) 50 MG tablet Take 1 tablet (50 mg total) by mouth daily. 30 tablet 6   emtricitabine-tenofovir AF (DESCOVY) 200-25 MG tablet Take 1 tablet by mouth daily. 30 tablet 6   enalapril (VASOTEC) 10 MG tablet Take 2 tablets by mouth 2 (two) times daily.     estradiol (ESTRACE) 0.1 MG/GM  vaginal cream Place 0.5 application vaginally once a week.     Fluocinolone Acetonide 0.01 % OIL Place 2 drops in ear(s) at bedtime.     fluticasone (FLONASE) 50 MCG/ACT nasal spray Place 1 spray into both nostrils daily.     gabapentin (NEURONTIN) 100 MG capsule Take 200 mg by mouth 2 (two) times daily.  glipiZIDE (GLUCOTROL) 5 MG tablet Take 5 mg by mouth daily before breakfast.     hydrALAZINE (APRESOLINE) 25 MG tablet Take 1 tablet by mouth 2 (two) times daily.     hydrochlorothiazide (HYDRODIURIL) 12.5 MG tablet Take 1 tablet by mouth daily.     hydrocortisone 2.5 % cream Apply 1 application topically in the morning and at bedtime.     lovastatin (MEVACOR) 40 MG tablet Take 40 mg by mouth at bedtime.      metFORMIN (GLUCOPHAGE) 500 MG tablet Take 1 tablet by mouth 2 (two) times daily with a meal.     metoprolol tartrate (LOPRESSOR) 25 MG tablet Take 25 mg by mouth 2 (two) times daily.      Multiple Vitamins-Minerals (MULTIVITAMIN ADULT PO) Take 1 tablet by mouth daily.     nystatin ointment (MYCOSTATIN) Apply 1 application topically 2 (two) times daily as needed (skin irritation).      omeprazole (PRILOSEC) 40 MG capsule Take 1 tablet by mouth daily.     Polyethyl Glycol-Propyl Glycol (SYSTANE) 0.4-0.3 % SOLN Apply 1 drop to eye daily as needed.     tiZANidine (ZANAFLEX) 2 MG tablet Take 2 tablets (4 mg total) by mouth at bedtime. 15 tablet 0   predniSONE (DELTASONE) 10 MG tablet Take by mouth. (Patient not taking: Reported on 08/23/2021)       Abtx:  Anti-infectives (From admission, onward)    Start     Dose/Rate Route Frequency Ordered Stop   08/24/21 1000  dolutegravir (TIVICAY) tablet 50 mg        50 mg Oral Daily 08/23/21 2225     08/24/21 1000  emtricitabine-tenofovir AF (DESCOVY) 200-25 MG per tablet 1 tablet        1 tablet Oral Daily 08/23/21 2225         REVIEW OF SYSTEMS:  Const: negative fever, negative chills, negative weight loss Eyes: negative diplopia or visual  changes, negative eye pain ENT: negative coryza, negative sore throat Resp: negative cough, hemoptysis, dyspnea Cards: negative for chest pain, palpitations, lower extremity edema GU: negative for frequency, dysuria and hematuria GI: Negative for abdominal pain, diarrhea, bleeding, constipation Skin: negative for rash and pruritus Heme: negative for easy bruising and gum/nose bleeding MS: negative for myalgias, arthralgias, back pain and muscle weakness Neurolo:, dizziness, , memory problems  Psych: negative for feelings of anxiety, depression  Endocrine:  diabetes Allergy/Immunology- negative for any medication or food allergies ?  Objective:  VITALS:  BP (!) 160/82 (BP Location: Left Arm)   Pulse (!) 58   Temp 98.4 F (36.9 C) (Oral)   Resp 15   SpO2 98%  PHYSICAL EXAM:  General: Alert, cooperative, no distress, appears stated age. Oriented in place/person/time X 5- her speech is at baseline Head: Normocephalic, without obvious abnormality, atraumatic. Eyes: Conjunctivae clear, anicteric sclerae. Pupils are equal ENT Nares normal. No drainage or sinus tenderness. Lips, mucosa, and tongue normal. No Thrush Neck: Supple, symmetrical, no adenopathy, thyroid: non tender no carotid bruit and no JVD. Back: No CVA tenderness. Lungs: Clear to auscultation bilaterally. No Wheezing or Rhonchi. No rales. Heart: Regular rate and rhythm, no murmur, rub or gallop. Abdomen: Soft, non-tender,not distended. Bowel sounds normal. No masses Extremities: atraumatic, no cyanosis. No edema. No clubbing Skin: No rashes or lesions. Or bruising Lymph: Cervical, supraclavicular normal. Neurologic: I made her walk- she is a cautious  and walks with broad base . While sitting in bed appear to sway to the left but not  consistent Pertinent Labs Lab Results CBC    Component Value Date/Time   WBC 10.5 08/24/2021 0515   RBC 3.86 (L) 08/24/2021 0515   HGB 12.7 08/24/2021 0515   HGB 11.7 08/31/2020 1037    HCT 36.2 08/24/2021 0515   HCT 34.5 08/31/2020 1037   PLT 276 08/24/2021 0515   PLT 269 08/31/2020 1037   MCV 93.8 08/24/2021 0515   MCV 95 08/31/2020 1037   MCH 32.9 08/24/2021 0515   MCHC 35.1 08/24/2021 0515   RDW 14.5 08/24/2021 0515   RDW 13.3 08/31/2020 1037   LYMPHSABS 1.4 08/23/2021 1607   LYMPHSABS 2.5 08/31/2020 1037   MONOABS 0.3 08/23/2021 1607   EOSABS 0.0 08/23/2021 1607   EOSABS 0.1 08/31/2020 1037   BASOSABS 0.0 08/23/2021 1607   BASOSABS 0.1 08/31/2020 1037    CMP Latest Ref Rng & Units 08/24/2021 08/23/2021 05/10/2021  Glucose 70 - 99 mg/dL 125(H) 153(H) 100(H)  BUN 8 - 23 mg/dL 15 20 15   Creatinine 0.44 - 1.00 mg/dL 0.93 0.92 0.99  Sodium 135 - 145 mmol/L 133(L) 127(L) 135  Potassium 3.5 - 5.1 mmol/L 4.0 4.1 3.9  Chloride 98 - 111 mmol/L 99 93(L) 99  CO2 22 - 32 mmol/L 25 23 25   Calcium 8.9 - 10.3 mg/dL 10.0 9.7 10.0  Total Protein 6.5 - 8.1 g/dL - 7.7 -  Total Bilirubin 0.3 - 1.2 mg/dL - 1.2 -  Alkaline Phos 38 - 126 U/L - 48 -  AST 15 - 41 U/L - 22 -  ALT 0 - 44 U/L - 17 -      Microbiology: Recent Results (from the past 240 hour(s))  Resp Panel by RT-PCR (Flu A&B, Covid) Nasopharyngeal Swab     Status: None   Collection Time: 08/23/21  9:41 PM   Specimen: Nasopharyngeal Swab; Nasopharyngeal(NP) swabs in vial transport medium  Result Value Ref Range Status   SARS Coronavirus 2 by RT PCR NEGATIVE NEGATIVE Final    Comment: (NOTE) SARS-CoV-2 target nucleic acids are NOT DETECTED.  The SARS-CoV-2 RNA is generally detectable in upper respiratory specimens during the acute phase of infection. The lowest concentration of SARS-CoV-2 viral copies this assay can detect is 138 copies/mL. A negative result does not preclude SARS-Cov-2 infection and should not be used as the sole basis for treatment or other patient management decisions. A negative result may occur with  improper specimen collection/handling, submission of specimen other than  nasopharyngeal swab, presence of viral mutation(s) within the areas targeted by this assay, and inadequate number of viral copies(<138 copies/mL). A negative result must be combined with clinical observations, patient history, and epidemiological information. The expected result is Negative.  Fact Sheet for Patients:  EntrepreneurPulse.com.au  Fact Sheet for Healthcare Providers:  IncredibleEmployment.be  This test is no t yet approved or cleared by the [place] FDA and  has been authorized for detection and/or diagnosis of SARS-CoV-2 by FDA under an Emergency Use Authorization (EUA). This EUA will remain  in effect (meaning this test can be used) for the duration of the COVID-19 declaration under Section 564(b)(1) of the Act, 21 U.S.C.section 360bbb-3(b)(1), unless the authorization is terminated  or revoked sooner.       Influenza A by PCR NEGATIVE NEGATIVE Final   Influenza B by PCR NEGATIVE NEGATIVE Final    Comment: (NOTE) The Xpert Xpress SARS-CoV-2/FLU/RSV plus assay is intended as an aid in the diagnosis of influenza from Nasopharyngeal swab specimens and should not be used as a  sole basis for treatment. Nasal washings and aspirates are unacceptable for Xpert Xpress SARS-CoV-2/FLU/RSV testing.  Fact Sheet for Patients: EntrepreneurPulse.com.au  Fact Sheet for Healthcare Providers: IncredibleEmployment.be  This test is not yet approved or cleared by the [place] FDA and has been authorized for detection and/or diagnosis of SARS-CoV-2 by FDA under an Emergency Use Authorization (EUA). This EUA will remain in effect (meaning this test can be used) for the duration of the COVID-19 declaration under Section 564(b)(1) of the Act, 21 U.S.C. section 360bbb-3(b)(1), unless the authorization is terminated or revoked.  Performed at [facility], [address] [place], [place]  [id number]     IMAGING RESULTS:  I have personally reviewed the films Abnormal diffusion restriction within the midline pons. No other diffusion abnormality. No acute or chronic hemorrhage. There is multifocal hyperintense T2-weighted signal within the white matter.?Diffusion abnormality of the midline pons. It is atypical of pontine ischemia to cross the midline. This could indicate osmotic demyelination.   Impression/Recommendation ?80 yr female with h/o HIV/HTN/DM presents with staggering feeling  slurred speech, off balance I know her for the past 3 years and her speech is at baseline today. Her gait is cautious and slightly broad based She has recently showed signs of early memory loss But she is oriented X5 MRI had shown pontine restrictive diffusion Rt vertebral artery showed occlusion but there is Reconstitution of the right V4 segment via collateral flow across the vertebrobasilar confluence The D.D is broad- CVA/ PRES, vasogenic edema, B12 ded- ( on omeprazole and metformin- both can cause low B12)  Hyponatremia but not severe to cause changes Her above findings are not due to HIV or complication of HAART or she has opportunistic infection  Last vl < 20 and cd4  > 600- continue Descovy/Tivicay ?will check VL, CD4, RPR  HTN   DM on metformin H/o MALT lymphoma - r/o paraneoplastic ___________________________________________________ Discussed with patient, requesting provider Note:  This document was prepared using Dragon voice recognition software and may include unintentional dictation errors.

## 2021-08-24 NOTE — Assessment & Plan Note (Signed)
Continue pravastatin 

## 2021-08-24 NOTE — Assessment & Plan Note (Signed)
Glucose normal -Continue SS corrections -Continue metformin

## 2021-08-24 NOTE — Assessment & Plan Note (Addendum)
Mild, asymptomatic.  Baseline appears to be around 135.  No recent hospitalizaiton, no recent history of severe hyponatremia.  I do not believe that a sodium of 127-128 could have led to ODS, nor that fluids overnight were related (clearly this ODS occurred 2-3 days prior to admission when her symptoms started) - Trend BMP

## 2021-08-24 NOTE — Evaluation (Signed)
Occupational Therapy Evaluation Patient Details Name: Janet Keller MRN: [id number] DOB: 1941/11/21 Today's Date: 08/24/2021   History of Present Illness 80 y.o. female with medical history significant for  hypertension, non-insulin-dependent diabetes mellitus, hyperlipidemia, GERD, HIV, who presents emergency department for chief concerns of imbalance.   Clinical Impression   Janet Keller was seen for OT evaluation this date. Prior to hospital admission, pt was generally independent with IADL/ADL management. Pt lives alone in a one level home with a son who lives nearby and would check on her weekly. Currently pt demonstrates impairments as described below (See OT problem list) which functionally limit her ability to perform ADL/self-care tasks. Pt currently requires supervision for UB ADL Management and functional mobility, MIN A for LB bathing and dressing.  Pt would benefit from skilled OT services to address noted impairments and functional limitations (see below for any additional details) in order to maximize safety and independence while minimizing falls risk and caregiver burden. Upon hospital discharge, recommend HHOT to maximize pt safety and return to functional independence during meaningful occupations of daily life.       Recommendations for follow up therapy are one component of a multi-disciplinary discharge planning process, led by the attending physician.  Recommendations may be updated based on patient status, additional functional criteria and insurance authorization.   Follow Up Recommendations  Home health OT    Equipment Recommendations  3 in 1 bedside commode    Recommendations for Other Services       Precautions / Restrictions Precautions Precautions: Fall Restrictions Weight Bearing Restrictions: No      Mobility Bed Mobility Overal bed mobility: Modified Independent             General bed mobility comments: Min increased time/effort to perform  with HOB elevated.    Transfers Overall transfer level: Modified independent Equipment used: 1 person hand held assist Transfers: Sit to/from Stand Sit to Stand: Min guard;Supervision         General transfer comment: cuing to insure appropriate UE use, able to rise w/o physical assist,    Balance Overall balance assessment: Needs assistance Sitting-balance support: No upper extremity supported;Feet unsupported Sitting balance-Leahy Scale: Good Sitting balance - Comments: Steady static sitting without feet support on stretcher   Standing balance support: Bilateral upper extremity supported Standing balance-Leahy Scale: Fair Standing balance comment: Mild sway backward upon initial standing attempt but pt is able to recover safely. Generally requires UE support for standing balance.                           ADL either performed or assessed with clinical judgement   ADL Overall ADL's : Needs assistance/impaired                                       General ADL Comments: Pt functionally limited by mild balance difficulties, generalized weakness, and decreased activity tolerance. She requires close supervision for standing EOB, anticipate MIN A for more exertional tasks such as LB dressing and bathing.     Vision Baseline Vision/History: 1 Wears glasses Ability to See in Adequate Light: 1 Impaired Patient Visual Report: No change from baseline       Perception     Praxis      Pertinent Vitals/Pain Pain Assessment: No/denies pain     Hand Dominance Right   Extremity/Trunk Assessment  Upper Extremity Assessment Upper Extremity Assessment: Generalized weakness (No focal weakness appreciated. Pt denies strenght or sensory deficits in BUE.)   Lower Extremity Assessment Lower Extremity Assessment: Defer to PT evaluation;Generalized weakness   Cervical / Trunk Assessment Cervical / Trunk Assessment: Normal   Communication  Communication Communication: No difficulties (Pt/son report speech is improved but not baseline.)   Cognition Arousal/Alertness: Awake/alert Behavior During Therapy: WFL for tasks assessed/performed Overall Cognitive Status: Within Functional Limits for tasks assessed                                     General Comments  Pt with decreased coordiantion and balance during functional mobility, still with mildly slurred speech    Exercises Other Exercises Other Exercises: Pt/caregiver (son at bedside) educated on falls prevention strategies, safe use of AE/DME for ADL management, and routines modifications for improved safety and functional independence upon hospital DC.   Shoulder Instructions      Home Living Family/patient expects to be discharged to:: Private residence Living Arrangements: Alone Available Help at Discharge: Family;Available PRN/intermittently (Son is available to check in on pt daily.) Type of Home: House Home Access: Stairs to enter [employer] of Steps: 3 Entrance Stairs-Rails: None Home Layout: One level     Bathroom Shower/Tub: Teacher, early years/pre: Standard     Home Equipment: None          Prior Functioning/Environment Level of Independence: Independent        Comments: Pt reports she is totally independent at baseline. She completes all IADL/ADL management without assist. She denies falls history in past year.        OT Problem List: Decreased strength;Decreased coordination;Decreased activity tolerance;Decreased safety awareness;Impaired balance (sitting and/or standing);Decreased knowledge of use of DME or AE;Pain;Decreased range of motion      OT Treatment/Interventions: Self-care/ADL training;Therapeutic exercise;Therapeutic activities;Energy conservation;DME and/or AE instruction;Patient/family education;Balance training    OT Goals(Current goals can be found in the care plan section) Acute Rehab  OT Goals Patient Stated Goal: go home OT Goal Formulation: With patient Time For Goal Achievement: 09/07/21 Potential to Achieve Goals: Good ADL Goals Pt Will Perform Grooming: standing;with modified independence Pt Will Transfer to Toilet: with modified independence;bedside commode;ambulating Pt Will Perform Toileting - Clothing Manipulation and hygiene: with modified independence;with adaptive equipment;sit to/from stand  OT Frequency: Min 1X/week   Barriers to D/C: Decreased caregiver support          Co-evaluation              AM-PAC OT "6 Clicks" Daily Activity     Outcome Measure Help from another person eating meals?: None Help from another person taking care of personal grooming?: A Little Help from another person toileting, which includes using toliet, bedpan, or urinal?: A Little Help from another person bathing (including washing, rinsing, drying)?: A Little Help from another person to put on and taking off regular upper body clothing?: A Little Help from another person to put on and taking off regular lower body clothing?: A Little 6 Click Score: 19   End of Session    Activity Tolerance: Patient tolerated treatment well Patient left: Other (comment);with family/visitor present (Seated EOB on stretcher with son present. Pt requests to sit upright for a while but does not have chair in hall.)  OT Visit Diagnosis: Other abnormalities of gait and mobility (R26.89)  Time: 1400-1420 OT Time Calculation (min): 20 min Charges:  OT General Charges $OT Visit: 1 Visit OT Evaluation $OT Eval Low Complexity: 1 Low OT Treatments $Self Care/Home Management : 8-22 mins  Shara Blazing, M.S., OTR/L Ascom: [phone] 08/24/21, 3:37 PM

## 2021-08-24 NOTE — Procedures (Signed)
Routine EEG Report  Janet Keller is a 80 y.o. female with a history of slurred speech and abnormal gait who is undergoing an EEG to evaluate for seizures.  Report: This EEG was acquired with electrodes placed according to the International 10-20 electrode system (including Fp1, Fp2, F3, F4, C3, C4, P3, P4, O1, O2, T3, T4, T5, T6, A1, A2, Fz, Cz, Pz). The following electrodes were missing or displaced: none.  The occipital dominant rhythm was 9 Hz. This activity is reactive to stimulation. Drowsiness was manifested by background fragmentation; deeper stages of sleep were identified by K complexes and sleep spindles. There was no focal slowing. There were no interictal epileptiform discharges. There were no electrographic seizures identified. There was no abnormal response to photic stimulation or hyperventilation.   Impression: This EEG was obtained while awake and asleep and is normal.    Clinical Correlation: Normal EEGs, however, do not rule out epilepsy.  Su Monks, MD [facility] [phone]  If 7pm- 7am, please page neurology on call as listed in [place].

## 2021-08-24 NOTE — Assessment & Plan Note (Signed)
MRI brain overnight showed ODS.  Unclear trigger.  - Consult Neurology - Consult ID re: ?PML, medicaiton effect causing ODS?  - Stop fluids

## 2021-08-24 NOTE — Assessment & Plan Note (Signed)
-   COnsult ID - Continue home ART

## 2021-08-24 NOTE — Progress Notes (Addendum)
SLP Cancellation Note  Patient Details Name: Janet Keller MRN: [id number] DOB: 1941-09-21   Cancelled treatment:       Reason Eval/Treat Not Completed:  (chart reviewed. Will await Neurology Consult and f/u after. Pt has been admitted to the ED less than 24 hours also.). Pt is currently on an oral diet w/ no reported swallowing issues; recommend general aspiration precautions w/ any oral intake. Noted report of dysarthria by Son/pt at admit. ST services will f/u.      Orinda Kenner, MS, CCC-SLP Speech Language Pathologist Rehab Services [phone] [facility] 08/24/2021, 11:00 AM

## 2021-08-24 NOTE — Evaluation (Signed)
Physical Therapy Evaluation Patient Details Name: Janet Keller MRN: [id number] DOB: [birth year] Today's Date: 08/24/2021  History of Present Illness  80 y.o. female with medical history significant for  hypertension, non-insulin-dependent diabetes mellitus, hyperlipidemia, GERD, HIV, who presents emergency department for chief concerns of imbalance.  Clinical Impression  Pt showed good effort with PT exam and though she was able to do some ambulation w/o AD she was clearly more stable and confident when using either the walker or holding PT's hand.  She did have a few bouts of stagger stepping/veering with no overt LOBs, but was clearly cautious/guarded.  By all accounts she was relatively activity and independent and is now having issues with balance, coordination as well as speech changes.  Pt lives alone, son reports that he can do daily check-ins, even provide 24/7 supervision if needed.       Recommendations for follow up therapy are one component of a multi-disciplinary discharge planning process, led by the attending physician.  Recommendations may be updated based on patient status, additional functional criteria and insurance authorization.  Follow Up Recommendations Home health PT;Supervision - Intermittent    Equipment Recommendations  Rolling walker with 5" wheels;3in1 (PT)    Recommendations for Other Services       Precautions / Restrictions Precautions Precautions: Fall Restrictions Weight Bearing Restrictions: No      Mobility  Bed Mobility Overal bed mobility: Modified Independent             General bed mobility comments: Pt able to get herself to sitting EOB from supine w/o assist, minimal UE reliance    Transfers Overall transfer level: Modified independent Equipment used: Rolling walker (2 wheeled) Transfers: Sit to/from Stand Sit to Stand: Min guard         General transfer comment: cuing to insure appropriate UE use, able to rise w/o  physical assist,  Ambulation/Gait Ambulation/Gait assistance: Supervision Gait Distance (Feet): 125 Feet Assistive device: Rolling walker (2 wheeled);1 person hand held assist       General Gait Details: Pt was able to ambulate with slow, occsaionally veering R gait.  She did not have any overt LOBs but showed decreased coordianation and a few bouts of stagger steps with the effort. Pt with only mild fatigue during the effort, but clearly guarded and less confidence; son and pt reports that she is slower and much less steady with ambulation than  at her baseline .  Stairs            Wheelchair Mobility    Modified Rankin (Stroke Patients Only)       Balance Overall balance assessment: Needs assistance Sitting-balance support: No upper extremity supported Sitting balance-Leahy Scale: Good     Standing balance support: Bilateral upper extremity supported Standing balance-Leahy Scale: Fair Standing balance comment: Pt struggled with alternating toe-tap marching, needed UE assist with heel raises and SLS.  Pt was able to maintain eyes closed static standing with light perturbations, LOBs with narrow BOS trial.                             Pertinent Vitals/Pain Pain Assessment: No/denies pain    Home Living Family/patient expects to be discharged to:: Private residence Living Arrangements: Alone Available Help at Discharge: Family;Available PRN/intermittently (son states he could do 24/7 if needed, but plans on daily check-ins) Type of Home: House Home Access: Stairs to enter Entrance Stairs-Rails: None Entrance Stairs-Number of Steps: 3 Home  Layout: One level Home Equipment: None      Prior Function Level of Independence: Independent         Comments: Pt was able to take care of errands, manage in the home, son would check in weekly/PRN     Hand Dominance        Extremity/Trunk Assessment   Upper Extremity Assessment Upper Extremity  Assessment: Overall WFL for tasks assessed;Generalized weakness (strength appears = and WFL b/l)    Lower Extremity Assessment Lower Extremity Assessment: Generalized weakness;Overall [facility] for tasks assessed (strength appears = and WFL b/l)       Communication   Communication:  (per son and pt report, enunciation/speech is improved but still not back to baseline)  Cognition Arousal/Alertness: Awake/alert Behavior During Therapy: WFL for tasks assessed/performed Overall Cognitive Status: Within Functional Limits for tasks assessed                                        General Comments General comments (skin integrity, edema, etc.): Pt with decreased coordiantion and balance during functional mobility, still with mildly slurred speech    Exercises     Assessment/Plan    PT Assessment Patient needs continued PT services  PT Problem List Decreased strength;Decreased range of motion;Decreased activity tolerance;Decreased mobility;Decreased balance;Decreased coordination;Decreased knowledge of use of DME;Decreased safety awareness       PT Treatment Interventions DME instruction;Gait training;Stair training;Functional mobility training;Therapeutic activities;Therapeutic exercise;Balance training;Neuromuscular re-education;Patient/family education    PT Goals (Current goals can be found in the Care Plan section)  Acute Rehab PT Goals Patient Stated Goal: go home PT Goal Formulation: With patient/family Time For Goal Achievement: 09/07/21 Potential to Achieve Goals: Good    Frequency Min 2X/week   Barriers to discharge        Co-evaluation               AM-PAC PT "6 Clicks" Mobility  Outcome Measure Help needed turning from your back to your side while in a flat bed without using bedrails?: A Little Help needed moving from lying on your back to sitting on the side of a flat bed without using bedrails?: A Little Help needed moving to and from a bed to a  chair (including a wheelchair)?: A Little Help needed standing up from a chair using your arms (e.g., wheelchair or bedside chair)?: A Little Help needed to walk in hospital room?: A Little Help needed climbing 3-5 steps with a railing? : A Little 6 Click Score: 18    End of Session Equipment Utilized During Treatment: Gait belt Activity Tolerance: Patient tolerated treatment well Patient left: in bed;with family/visitor present;with call bell/phone within reach   PT Visit Diagnosis: Muscle weakness (generalized) (M62.81);Difficulty in walking, not elsewhere classified (R26.2)    Time: 7416-3845 PT Time Calculation (min) (ACUTE ONLY): 32 min   Charges:   PT Evaluation $PT Eval Low Complexity: 1 Low PT Treatments $Gait Training: 8-22 mins        Kreg Shropshire, DPT 08/24/2021, 1:37 PM

## 2021-08-24 NOTE — Progress Notes (Signed)
Eeg done 

## 2021-08-25 ENCOUNTER — Observation Stay
Admit: 2021-08-25 | Discharge: 2021-08-25 | Disposition: A | Discharge status: Home / Self Care | Payer: Medicare HMO | Attending: Neurology | Admitting: Neurology

## 2021-08-25 ENCOUNTER — Ambulatory Visit: Payer: Medicare HMO | Admitting: Infectious Diseases

## 2021-08-25 DIAGNOSIS — E871 Hypo-osmolality and hyponatremia: Secondary | ICD-10-CM | POA: Diagnosis not present

## 2021-08-25 DIAGNOSIS — I639 Cerebral infarction, unspecified: Secondary | ICD-10-CM

## 2021-08-25 DIAGNOSIS — G372 Central pontine myelinolysis: Secondary | ICD-10-CM | POA: Diagnosis not present

## 2021-08-25 LAB — ECHOCARDIOGRAM COMPLETE BUBBLE STUDY
AR max vel: 2.71 cm2
AV Area VTI: 3.9 cm2
AV Area mean vel: 2.94 cm2
AV Mean grad: 5 mmHg
AV Peak grad: 9.5 mmHg
Ao pk vel: 1.54 m/s
Area-P 1/2: 3.79 cm2
S' Lateral: 2.5 cm

## 2021-08-25 LAB — BASIC METABOLIC PANEL
Anion gap: 10 (ref 5–15)
BUN: 14 mg/dL (ref 8–23)
CO2: 26 mmol/L (ref 22–32)
Calcium: 9.9 mg/dL (ref 8.9–10.3)
Chloride: 99 mmol/L (ref 98–111)
Creatinine, Ser: 0.88 mg/dL (ref 0.44–1.00)
GFR, Estimated: >60 mL/min (ref >60)
Glucose, Bld: 138 mg/dL — ABNORMAL HIGH (ref 70–99)
Potassium: 3.9 mmol/L (ref 3.5–5.1)
Sodium: 135 mmol/L (ref 135–145)

## 2021-08-25 LAB — GLUCOSE, CAPILLARY
Glucose-Capillary: 139 mg/dL — ABNORMAL HIGH (ref 70–99)
Glucose-Capillary: 199 mg/dL — ABNORMAL HIGH (ref 70–99)

## 2021-08-25 MED ORDER — SENNOSIDES-DOCUSATE SODIUM 8.6-50 MG PO TABS
1.0000 | ORAL_TABLET | Freq: Once | ORAL | Status: AC
  Administered 2021-08-25: 1 via ORAL
  Filled 2021-08-25: qty 1

## 2021-08-25 MED ORDER — ATORVASTATIN CALCIUM 40 MG PO TABS: 40.0000 mg | ORAL_TABLET | Freq: Every day | ORAL | 11 refills | Status: DC

## 2021-08-25 MED ORDER — CLOPIDOGREL BISULFATE 75 MG PO TABS
75.0000 mg | ORAL_TABLET | Freq: Every day | ORAL | 0 refills | Status: DC
Start: 2021-08-26 — End: 2024-04-15

## 2021-08-25 MED ORDER — CLOPIDOGREL BISULFATE 75 MG PO TABS
75.0000 mg | ORAL_TABLET | Freq: Every day | ORAL | Status: DC
  Administered 2021-08-25: 09:00:00 75 mg via ORAL
  Filled 2021-08-25: qty 1

## 2021-08-25 NOTE — Progress Notes (Signed)
SLP Cancellation Note  Patient Details Name: Janet Keller MRN: [id number] DOB: Apr 18, 1941   Cancelled treatment:       Reason Eval/Treat Not Completed: SLP screened, no needs identified, will sign off (chart reviewed; consulted NSG and met w/ pt). Pt was talking on her cell phone upon entering room. Pt denied any difficulty swallowing and is currently on a regular diet; tolerates swallowing pills w/ water per NSG. Pt was eating her Lunch meal while in room w/out deficits. Pt conversed in conversation w/out overt expressive/receptive deficits noted; pt denied any current speech-language deficits. Speech intelligible, clear when pt fully articulated vs when talking w/ a more closed mouth. Pt is missing some Dentition. No further skilled ST services indicated as pt appears at/close to her baseline. Pt agreed to f/u w/ PCP if new needs indicated. NSG to reconsult if any change in status while admitted.       Orinda Kenner, MS, CCC-SLP Speech Language Pathologist Rehab Services [phone] [facility] 08/25/2021, 12:29 PM

## 2021-08-25 NOTE — Discharge Summary (Signed)
Physician Discharge Summary  Janet Keller JHE:[id number] DOB: Sep 22, 1941 DOA: 08/23/2021  PCP: Baxter Hire, MD  Admit date: 08/23/2021 Discharge date: 08/25/2021  Admitted From: Home  Disposition:  Home with [facility]   Recommendations for Outpatient Follow-up:  Follow up with Dr. Edwina Barth PCP in 1 week Follow up with Neurology Dr. Manuella Ghazi within 4 weeks Dr. Manuella Ghazi: Please follow up on the following pending results: RPR Heavy metals, blood Copper, serum Ceruloplasmin Vitamin B1      Home Health: PT/OT due to balance issues  Equipment/Devices: Walker  Discharge Condition: Good  CODE STATUS: FULL Diet recommendation: Cardiac  Brief/Interim Summary: Janet Keller is an 80 y.o. F with HIV, well controlled, MALT lymphoma in remission, HTN and DM who presented with 3 days imbalance, slurred speech.  In the ER, Na 128, MRI brain showed findings consistent with ODS.        PRINCIPAL HOSPITAL DIAGNOSIS: Acute bilateral pontine stroke    Discharge Diagnoses:   Acute bilateral pontine stroke Because of the non-enhancing lesion crossing the midline on MRI, initial consideration of stroke was low, differential included osmotic demyelination (bolstered by moderate hyponatremia at admission), heavy metal or carbon dioxide poisoning, PML, paraneoplastic syndromes.  B12 and TSH normal.  ID were consulted, who felt PML unlikely given her well controlled HIV.  After further review by Neurology, and in setting of occluded R vertebral, bilateral pontine infarct was considered statistically more likely the cause. - MRI C and T spine with and without contrast was considered to evaluate for chiropractic induced injury and B12 deficiency, but is deferred.  -Non-invasive angiography showed occluded right vertebral, no carotid disease. -Echocardiogram showed no cardiogenic source of embolism -Lipids ordered: LDL 99, lovastatin changed to atorvastatin   -Aspirin ordered at admission -->  discharged on aspirin and Plavix for 3 weeks then aspirin alone -Atrial fibrillation: not present on telemetry -tPA not given because outside window -Dysphagia screen ordered in ER -PT eval ordered: recommended HHPT            Peripheral vascular disease CTA head and neck showed occluded R vertebral artery.  No intervention possible.  HIV Well controlled.  Hyponatremia Mild asymptomatic.  See above, ultimately doubt ODS.  Hypertension  Type 2 diabetes without complication without insulin use A1c in excellent control.  MALT Lymphoma Remote history, not active.         Discharge Instructions  Discharge Instructions     Diet - low sodium heart healthy   Complete by: As directed    Discharge instructions   Complete by: As directed    From Dr. Loleta Books: You were admitted for changes to your speech and feeling off balance. Here, we found an abnormal signal on your MRI.  Initially, this did not appear to be from a stroke, but rather from something else LIKE a stroke.  We sent off numerous tests to investigate this further.  However, after careful review by our neurology specialist  and her associates, we feel this truly was from a stroke.    For that reason, we recommend the following things: Eat a low fat diet Continue your low dose aspirin 81 mg daily For the next 3 weeks, take clopidogrel/Plavix 75 mg daily (this is like a super aspirin, greasing the blood vessels)  (While you are taking Plavix, do not take your antacid medicine omeprazole/Prilosec, these two medicines interact.  After you finish the three weeks of Plavix, you may restart omeprazole)  Stop your cholesterol medicine lovastatin and start  the alternative which is more effective called atorvastatin  Continue all your home blood pressure medicines (in fact, take them today when you get home)  Call Dr. Trena Platt office and schedule a follow up appointment in 2-4 weeks   Increase activity slowly    Complete by: As directed       Allergies as of 08/25/2021   No Known Allergies      Medication List     STOP taking these medications    lovastatin 40 MG tablet Commonly known as: MEVACOR   omeprazole 40 MG capsule Commonly known as: PRILOSEC   predniSONE 10 MG tablet Commonly known as: DELTASONE       TAKE these medications    acetaminophen 500 MG tablet Commonly known as: TYLENOL Take 500 mg by mouth every 6 (six) hours as needed.   acetic acid-hydrocortisone OTIC solution Commonly known as: VOSOL-HC Place 4 drops in ear(s) 2 (two) times a week.   aspirin EC 81 MG tablet Take 81 mg by mouth daily.   atorvastatin 40 MG tablet Commonly known as: Lipitor Take 1 tablet (40 mg total) by mouth daily.   CALCIUM-CARB 600 PO Take 2 tablets by mouth daily.   clopidogrel 75 MG tablet Commonly known as: PLAVIX Take 1 tablet (75 mg total) by mouth daily. Start taking on: August 26, 2021   clotrimazole-betamethasone cream Commonly known as: LOTRISONE Apply topically 2 (two) times daily.   emtricitabine-tenofovir AF 200-25 MG tablet Commonly known as: DESCOVY Take 1 tablet by mouth daily.   enalapril 10 MG tablet Commonly known as: VASOTEC Take 2 tablets by mouth 2 (two) times daily.   estradiol 0.1 MG/GM vaginal cream Commonly known as: ESTRACE Place 0.5 application vaginally once a week.   Fluocinolone Acetonide 0.01 % Oil Place 2 drops in ear(s) at bedtime.   fluticasone 50 MCG/ACT nasal spray Commonly known as: FLONASE Place 1 spray into both nostrils daily.   gabapentin 100 MG capsule Commonly known as: NEURONTIN Take 200 mg by mouth 2 (two) times daily.   glipiZIDE 5 MG tablet Commonly known as: GLUCOTROL Take 5 mg by mouth daily before breakfast.   hydrALAZINE 25 MG tablet Commonly known as: APRESOLINE Take 1 tablet by mouth 2 (two) times daily.   hydrochlorothiazide 12.5 MG tablet Commonly known as: HYDRODIURIL Take 1 tablet by  mouth daily.   hydrocortisone 2.5 % cream Apply 1 application topically in the morning and at bedtime.   metFORMIN 500 MG tablet Commonly known as: GLUCOPHAGE Take 1 tablet by mouth 2 (two) times daily with a meal.   metoprolol tartrate 25 MG tablet Commonly known as: LOPRESSOR Take 25 mg by mouth 2 (two) times daily.   MULTIVITAMIN ADULT PO Take 1 tablet by mouth daily.   nystatin ointment Commonly known as: MYCOSTATIN Apply 1 application topically 2 (two) times daily as needed (skin irritation).   Systane 0.4-0.3 % Soln Generic drug: Polyethyl Glycol-Propyl Glycol Apply 1 drop to eye daily as needed.   Tivicay 50 MG tablet Generic drug: dolutegravir Take 1 tablet (50 mg total) by mouth daily.   tiZANidine 2 MG tablet Commonly known as: ZANAFLEX Take 2 tablets (4 mg total) by mouth at bedtime.               Durable Medical Equipment  (From admission, onward)           Start     Ordered   08/25/21 1115  For home use only DME Walker rolling  Once       Question Answer Comment  Walker: With [place]   Patient needs a walker to treat with the following condition Weakness      08/25/21 1114            Follow-up Information     Baxter Hire, MD. Schedule an appointment as soon as possible for a visit in 1 week(s).   Specialty: Internal Medicine Contact information: [place] [address] [phone]         Vladimir Crofts, MD. Schedule an appointment as soon as possible for a visit in 2 week(s).   Specialty: Neurology Contact information: [place] [facility] [place] [address] [phone]                No Known Allergies  Consultations: Neurology ID   Procedures/Studies: CT ANGIO HEAD NECK W WO CM  Result Date: 08/23/2021 CLINICAL DATA:  Slurred speech and dizziness EXAM: CT ANGIOGRAPHY HEAD AND NECK TECHNIQUE: Multidetector CT imaging of the head and neck  was performed using the standard protocol during bolus administration of intravenous contrast. Multiplanar CT image reconstructions and MIPs were obtained to evaluate the vascular anatomy. Carotid stenosis measurements (when applicable) are obtained utilizing NASCET criteria, using the distal internal carotid diameter as the denominator. CONTRAST:  28mL OMNIPAQUE IOHEXOL 350 MG/ML SOLN COMPARISON:  None. FINDINGS: CTA NECK FINDINGS SKELETON: There is no bony spinal canal stenosis. No lytic or blastic lesion. OTHER NECK: Normal pharynx, larynx and major salivary glands. No cervical lymphadenopathy. Unremarkable thyroid gland. UPPER CHEST: No pneumothorax or pleural effusion. No nodules or masses. AORTIC ARCH: There is no calcific atherosclerosis of the aortic arch. There is no aneurysm, dissection or hemodynamically significant stenosis of the visualized portion of the aorta. Conventional 3 vessel aortic branching pattern. The visualized proximal subclavian arteries are widely patent. RIGHT CAROTID SYSTEM: No dissection, occlusion or aneurysm. Mild atherosclerotic calcification at the carotid bifurcation without hemodynamically significant stenosis. LEFT CAROTID SYSTEM: No dissection, occlusion or aneurysm. Mild atherosclerotic calcification at the carotid bifurcation without hemodynamically significant stenosis. VERTEBRAL ARTERIES: Left dominant configuration. Right vertebral artery is occluded along its entire length. The left vertebral artery is normal. CTA HEAD FINDINGS POSTERIOR CIRCULATION: --Vertebral arteries: Reconstitution of the right V4 segment via collateral flow across the vertebrobasilar confluence. --Inferior cerebellar arteries: Normal. --Basilar artery: Normal. --Superior cerebellar arteries: Normal. --Posterior cerebral arteries (PCA): Normal. ANTERIOR CIRCULATION: --Intracranial internal carotid arteries: Normal. --Anterior cerebral arteries (ACA): Normal. Both A1 segments are present. Patent  anterior communicating artery (a-comm). --Middle cerebral arteries (MCA): Normal. VENOUS SINUSES: As permitted by contrast timing, patent. ANATOMIC VARIANTS: None Review of the MIP images confirms the above findings. IMPRESSION: 1. No intracranial arterial occlusion or high-grade stenosis. 2. Occlusion of the right vertebral artery along its entire length. Aortic Atherosclerosis (ICD10-I70.0). Electronically Signed   By: Ulyses Jarred M.D.   On: 08/23/2021 23:04   CT HEAD WO CONTRAST (5MM)  Result Date: 08/23/2021 CLINICAL DATA:  Dizziness, slurred speech, imbalance, symptoms for 3 days, worsened slurred speech EXAM: CT HEAD WITHOUT CONTRAST TECHNIQUE: Contiguous axial images were obtained from the base of the skull through the vertex without intravenous contrast. Sagittal and coronal MPR images reconstructed from axial data set. COMPARISON:  MR brain 08/28/2020 FINDINGS: Brain: Normal ventricular morphology. No midline shift or mass effect. Minimal small vessel chronic ischemic changes of deep cerebral white matter. No intracranial hemorrhage, mass lesion, or evidence of acute infarction. No  extra-axial fluid collections. Vascular: Atherosclerotic calcification of internal carotid arteries at skull base. Skull: Intact. Mild hyperostosis frontalis interna. Extensive pannus surrounding the odontoid process Sinuses/Orbits: Opacified RIGHT mastoid air cells and middle ear cavity. Other: N/A IMPRESSION: Minimal small vessel chronic ischemic changes of deep cerebral white matter. No acute intracranial abnormalities. Opacified RIGHT mastoid air cells and middle ear cavity. Electronically Signed   By: Lavonia Dana M.D.   On: 08/23/2021 16:47   MR ANGIO HEAD WO CONTRAST  Result Date: 08/23/2021 CLINICAL DATA:  Three days slurred speech and dizziness. Upper extremity weakness. EXAM: MRI HEAD WITHOUT CONTRAST MRA HEAD WITHOUT CONTRAST MRA NECK WITHOUT CONTRAST TECHNIQUE: Multiplanar, multiecho pulse sequences of the  brain and surrounding structures were obtained without intravenous contrast. Angiographic images of the Circle of Willis were obtained using MRA technique without intravenous contrast. Angiographic images of the neck were obtained using MRA technique without intravenous contrast. Carotid stenosis measurements (when applicable) are obtained utilizing NASCET criteria, using the distal internal carotid diameter as the denominator. COMPARISON:  None. FINDINGS: MRI HEAD FINDINGS Brain: Abnormal diffusion restriction within the midline pons. No other diffusion abnormality. No acute or chronic hemorrhage. There is multifocal hyperintense T2-weighted signal within the white matter. Generalized volume loss without a clear lobar predilection. Old right cerebellar infarct. The midline structures are normal. Vascular: Major flow voids are preserved. Skull and upper cervical spine: Normal calvarium and skull base. Visualized upper cervical spine and soft tissues are normal. Sinuses/Orbits:Right mastoid effusion.  Normal orbits. MRA HEAD FINDINGS POSTERIOR CIRCULATION: --Vertebral arteries: Normal --Inferior cerebellar arteries: Normal. --Basilar artery: Normal. --Superior cerebellar arteries: Normal. --Posterior cerebral arteries: Normal. ANTERIOR CIRCULATION: --Intracranial internal carotid arteries: Normal. --Anterior cerebral arteries (ACA): Normal. --Middle cerebral arteries (MCA): Normal. ANATOMIC VARIANTS: Both posterior communicating arteries are patent. MRA NECK FINDINGS There is no flow related enhancement seen within the right vertebral artery. The left vertebral artery is normal to the skull base. There is an enhancement defect within the proximal left internal carotid artery, which is otherwise normal. IMPRESSION: 1. Diffusion abnormality of the midline pons. It is atypical of pontine ischemia to cross the midline. This could indicate osmotic demyelination. 2. No intracranial large vessel occlusion. 3. Loss of flow  related enhancement within the right vertebral artery may indicate occlusion or high-grade stenosis. 4. Loss of enhancement within the proximal left internal carotid artery may indicate internal thrombus or high-grade stenosis. CTA of the neck might be helpful for further characterization. Electronically Signed   By: Ulyses Jarred M.D.   On: 08/23/2021 22:02   MR ANGIO NECK WO CONTRAST  Result Date: 08/23/2021 CLINICAL DATA:  Three days slurred speech and dizziness. Upper extremity weakness. EXAM: MRI HEAD WITHOUT CONTRAST MRA HEAD WITHOUT CONTRAST MRA NECK WITHOUT CONTRAST TECHNIQUE: Multiplanar, multiecho pulse sequences of the brain and surrounding structures were obtained without intravenous contrast. Angiographic images of the Circle of Willis were obtained using MRA technique without intravenous contrast. Angiographic images of the neck were obtained using MRA technique without intravenous contrast. Carotid stenosis measurements (when applicable) are obtained utilizing NASCET criteria, using the distal internal carotid diameter as the denominator. COMPARISON:  None. FINDINGS: MRI HEAD FINDINGS Brain: Abnormal diffusion restriction within the midline pons. No other diffusion abnormality. No acute or chronic hemorrhage. There is multifocal hyperintense T2-weighted signal within the white matter. Generalized volume loss without a clear lobar predilection. Old right cerebellar infarct. The midline structures are normal. Vascular: Major flow voids are preserved. Skull and upper cervical spine: Normal calvarium and  skull base. Visualized upper cervical spine and soft tissues are normal. Sinuses/Orbits:Right mastoid effusion.  Normal orbits. MRA HEAD FINDINGS POSTERIOR CIRCULATION: --Vertebral arteries: Normal --Inferior cerebellar arteries: Normal. --Basilar artery: Normal. --Superior cerebellar arteries: Normal. --Posterior cerebral arteries: Normal. ANTERIOR CIRCULATION: --Intracranial internal carotid  arteries: Normal. --Anterior cerebral arteries (ACA): Normal. --Middle cerebral arteries (MCA): Normal. ANATOMIC VARIANTS: Both posterior communicating arteries are patent. MRA NECK FINDINGS There is no flow related enhancement seen within the right vertebral artery. The left vertebral artery is normal to the skull base. There is an enhancement defect within the proximal left internal carotid artery, which is otherwise normal. IMPRESSION: 1. Diffusion abnormality of the midline pons. It is atypical of pontine ischemia to cross the midline. This could indicate osmotic demyelination. 2. No intracranial large vessel occlusion. 3. Loss of flow related enhancement within the right vertebral artery may indicate occlusion or high-grade stenosis. 4. Loss of enhancement within the proximal left internal carotid artery may indicate internal thrombus or high-grade stenosis. CTA of the neck might be helpful for further characterization. Electronically Signed   By: Ulyses Jarred M.D.   On: 08/23/2021 22:02   MR BRAIN WO CONTRAST  Result Date: 08/23/2021 CLINICAL DATA:  Three days slurred speech and dizziness. Upper extremity weakness. EXAM: MRI HEAD WITHOUT CONTRAST MRA HEAD WITHOUT CONTRAST MRA NECK WITHOUT CONTRAST TECHNIQUE: Multiplanar, multiecho pulse sequences of the brain and surrounding structures were obtained without intravenous contrast. Angiographic images of the Circle of Willis were obtained using MRA technique without intravenous contrast. Angiographic images of the neck were obtained using MRA technique without intravenous contrast. Carotid stenosis measurements (when applicable) are obtained utilizing NASCET criteria, using the distal internal carotid diameter as the denominator. COMPARISON:  None. FINDINGS: MRI HEAD FINDINGS Brain: Abnormal diffusion restriction within the midline pons. No other diffusion abnormality. No acute or chronic hemorrhage. There is multifocal hyperintense T2-weighted signal within  the white matter. Generalized volume loss without a clear lobar predilection. Old right cerebellar infarct. The midline structures are normal. Vascular: Major flow voids are preserved. Skull and upper cervical spine: Normal calvarium and skull base. Visualized upper cervical spine and soft tissues are normal. Sinuses/Orbits:Right mastoid effusion.  Normal orbits. MRA HEAD FINDINGS POSTERIOR CIRCULATION: --Vertebral arteries: Normal --Inferior cerebellar arteries: Normal. --Basilar artery: Normal. --Superior cerebellar arteries: Normal. --Posterior cerebral arteries: Normal. ANTERIOR CIRCULATION: --Intracranial internal carotid arteries: Normal. --Anterior cerebral arteries (ACA): Normal. --Middle cerebral arteries (MCA): Normal. ANATOMIC VARIANTS: Both posterior communicating arteries are patent. MRA NECK FINDINGS There is no flow related enhancement seen within the right vertebral artery. The left vertebral artery is normal to the skull base. There is an enhancement defect within the proximal left internal carotid artery, which is otherwise normal. IMPRESSION: 1. Diffusion abnormality of the midline pons. It is atypical of pontine ischemia to cross the midline. This could indicate osmotic demyelination. 2. No intracranial large vessel occlusion. 3. Loss of flow related enhancement within the right vertebral artery may indicate occlusion or high-grade stenosis. 4. Loss of enhancement within the proximal left internal carotid artery may indicate internal thrombus or high-grade stenosis. CTA of the neck might be helpful for further characterization. Electronically Signed   By: Ulyses Jarred M.D.   On: 08/23/2021 22:02   MR BRAIN W CONTRAST  Result Date: 08/24/2021 CLINICAL DATA:  Further evaluation of FLAIR hyperintensity in the pons seen on prior brain MRI EXAM: MRI HEAD WITH CONTRAST TECHNIQUE: Multiplanar, multiecho pulse sequences of the brain and surrounding structures were obtained with intravenous contrast.  CONTRAST:  73mL GADAVIST GADOBUTROL 1 MMOL/ML IV SOLN COMPARISON:  CT/CTA head and brain MRI/MRA 08/23/2021 FINDINGS: Brain: There is no abnormal enhancement in the pons to correspond to the signal abnormality seen on the prior noncontrast brain MRI. There is no other abnormal intracranial enhancement. There is no mass lesion. There is no midline shift. The ventricles are stable in size. Vascular: There is normal enhancement of the major intracranial vessels. Skull and upper cervical spine: No abnormal enhancement. Sinuses/Orbits: Negative. Other: None. IMPRESSION: No abnormal enhancement of the pons corresponding to the signal abnormality seen on the MRI from 1 day prior; neoplasm is unlikely. Electronically Signed   By: Valetta Mole M.D.   On: 08/24/2021 12:52   US Venous Img Upper Uni Right(DVT)  Result Date: 08/12/2021 CLINICAL DATA:  RIGHT upper extremity swelling x2 weeks EXAM: RIGHT UPPER EXTREMITY VENOUS DOPPLER ULTRASOUND TECHNIQUE: Gray-scale sonography with graded compression, as well as color Doppler and duplex ultrasound were performed to evaluate the upper extremity deep venous system from the level of the subclavian vein and including the jugular, axillary, basilic, radial, ulnar and upper cephalic vein. Spectral Doppler was utilized to evaluate flow at rest and with distal augmentation maneuvers. COMPARISON:  CT CAP, 05/07/2021. FINDINGS: Contralateral Subclavian Vein: Respiratory phasicity is normal and symmetric with the symptomatic side. No evidence of thrombus. Normal compressibility. Internal Jugular Vein: No evidence of thrombus. Normal compressibility, respiratory phasicity and response to augmentation. Subclavian Vein: No evidence of thrombus. Normal compressibility, respiratory phasicity and response to augmentation. Axillary Vein: No evidence of thrombus. Normal compressibility, respiratory phasicity and response to augmentation. Cephalic Vein: No evidence of thrombus. Normal  compressibility, respiratory phasicity and response to augmentation. Basilic Vein: No evidence of thrombus. Normal compressibility, respiratory phasicity and response to augmentation. Brachial Veins: No evidence of thrombus. Normal compressibility, respiratory phasicity and response to augmentation. Radial Veins: No evidence of thrombus. Normal compressibility, respiratory phasicity and response to augmentation. Ulnar Veins: No evidence of thrombus. Normal compressibility, respiratory phasicity and response to augmentation. Venous Reflux:  None visualized. Other Findings:  None visualized. IMPRESSION: No evidence of DVT within the RIGHT upper extremity. Michaelle Birks, MD [facility] Electronically Signed   By: Michaelle Birks M.D.   On: 08/12/2021 16:57   EEG adult  Result Date: 08/24/2021 Derek Jack, MD     08/24/2021  8:31 PM Routine EEG Report Janet Keller is a 80 y.o. female with a history of slurred speech and abnormal gait who is undergoing an EEG to evaluate for seizures. Report: This EEG was acquired with electrodes placed according to the International 10-20 electrode system (including Fp1, Fp2, F3, F4, C3, C4, P3, P4, O1, O2, T3, T4, T5, T6, A1, A2, Fz, Cz, Pz). The following electrodes were missing or displaced: none. The occipital dominant rhythm was 9 Hz. This activity is reactive to stimulation. Drowsiness was manifested by background fragmentation; deeper stages of sleep were identified by K complexes and sleep spindles. There was no focal slowing. There were no interictal epileptiform discharges. There were no electrographic seizures identified. There was no abnormal response to photic stimulation or hyperventilation. Impression: This EEG was obtained while awake and asleep and is normal.   Clinical Correlation: Normal EEGs, however, do not rule out epilepsy. Su Monks, MD [facility] [phone] If 7pm- 7am, please  page neurology on call as listed in [place].   ECHOCARDIOGRAM COMPLETE BUBBLE STUDY  Result Date: 08/25/2021    ECHOCARDIOGRAM REPORT  Patient Name:   Janet Keller Date of Exam: 08/25/2021 Medical Rec #:  [id number]          Height:       64.0 in Accession #:    [id number]         Weight:       135.9 lb Date of Birth:  June 09, 1941           BSA:          1.660 m Patient Age:    19 years           BP:           163/83 mmHg Patient Gender: F                  HR:           71 bpm. Exam Location:  [facility] Procedure: 2D Echo, Cardiac Doppler, Color Doppler and Saline Contrast Bubble            Study Indications:     Stroke 434.91 / I63.9  History:         Patient has no prior history of Echocardiogram examinations.  Sonographer:     Sherrie Sport Referring Phys:  [place] Diagnosing Phys: Serafina Royals MD  Sonographer Comments: Suboptimal apical window and suboptimal parasternal window. Best view is subcostal. IMPRESSIONS  1. Left ventricular ejection fraction, by estimation, is 60 to 65%. The left ventricle has normal function. The left ventricle has no regional wall motion abnormalities. Left ventricular diastolic parameters were normal.  2. Right ventricular systolic function is normal. The right ventricular size is normal.  3. The mitral valve is normal in structure. No evidence of mitral valve regurgitation.  4. The aortic valve is normal in structure. Aortic valve regurgitation is not visualized.  5. Agitated saline contrast bubble study was negative, with no evidence of any interatrial shunt. FINDINGS  Left Ventricle: Left ventricular ejection fraction, by estimation, is 60 to 65%. The left ventricle has normal function. The left ventricle has no regional wall motion abnormalities. The left ventricular internal cavity size was small. There is no left ventricular hypertrophy. Left ventricular diastolic parameters were normal. Right Ventricle: The right ventricular size is normal. No increase in right  ventricular wall thickness. Right ventricular systolic function is normal. Left Atrium: Left atrial size was normal in size. Right Atrium: Right atrial size was normal in size. Pericardium: There is no evidence of pericardial effusion. Mitral Valve: The mitral valve is normal in structure. No evidence of mitral valve regurgitation. Tricuspid Valve: The tricuspid valve is normal in structure. Tricuspid valve regurgitation is trivial. Aortic Valve: The aortic valve is normal in structure. Aortic valve regurgitation is not visualized. Aortic valve mean gradient measures 5.0 mmHg. Aortic valve peak gradient measures 9.5 mmHg. Aortic valve area, by VTI measures 3.90 cm. Pulmonic Valve: The pulmonic valve was normal in structure. Pulmonic valve regurgitation is not visualized. Aorta: The aortic root and ascending aorta are structurally normal, with no evidence of dilitation. IAS/Shunts: No atrial level shunt detected by color flow Doppler. Agitated saline contrast was given intravenously to evaluate for intracardiac shunting. Agitated saline contrast bubble study was negative, with no evidence of any interatrial shunt.  LEFT VENTRICLE PLAX 2D LVIDd:         3.60 cm  Diastology LVIDs:         2.50 cm  LV e' medial:    4.46 cm/s LV PW:  1.40 cm  LV E/e' medial:  15.7 LV IVS:        1.20 cm  LV e' lateral:   6.09 cm/s LVOT diam:     2.00 cm  LV E/e' lateral: 11.5 LV SV:         96 LV SV Index:   58 LVOT Area:     3.14 cm  RIGHT VENTRICLE RV Basal diam:  2.70 cm RV S prime:     14.50 cm/s TAPSE (M-mode): 3.2 cm LEFT ATRIUM             Index       RIGHT ATRIUM           Index LA diam:        4.10 cm 2.47 cm/m  RA Area:     16.40 cm LA Vol (A2C):   53.2 ml 32.05 ml/m RA Volume:   47.50 ml  28.61 ml/m LA Vol (A4C):   46.3 ml 27.89 ml/m LA Biplane Vol: 52.3 ml 31.50 ml/m  AORTIC VALVE                    PULMONIC VALVE AV Area (Vmax):    2.71 cm     PV Vmax:        0.89 m/s AV Area (Vmean):   2.94 cm     PV Peak  grad:   3.1 mmHg AV Area (VTI):     3.90 cm     RVOT Peak grad: 5 mmHg AV Vmax:           154.00 cm/s AV Vmean:          106.000 cm/s AV VTI:            0.246 m AV Peak Grad:      9.5 mmHg AV Mean Grad:      5.0 mmHg LVOT Vmax:         133.00 cm/s LVOT Vmean:        99.200 cm/s LVOT VTI:          0.305 m LVOT/AV VTI ratio: 1.24  AORTA Ao Root diam: 3.60 cm MITRAL VALVE                TRICUSPID VALVE MV Area (PHT): 3.79 cm     TR Peak grad:   33.4 mmHg MV Decel Time: 200 msec     TR Vmax:        289.00 cm/s MV E velocity: 69.80 cm/s MV A velocity: 115.00 cm/s  SHUNTS MV E/A ratio:  0.61         Systemic VTI:  0.30 m                             Systemic Diam: 2.00 cm Serafina Royals MD Electronically signed by Serafina Royals MD Signature Date/Time: 08/25/2021/12:15:41 PM    Final       Subjective: Feeling well.  Appetite good.  No new focal weakness, numbness, change in speech, confusion, fever, loss of consciousness.  Discharge Exam: Vitals:   08/25/21 0802 08/25/21 1246  BP: (!) 163/83 (!) 141/76  Pulse: 71 90  Resp: 20 18  Temp: (!) 97 F (36.1 C) 98.6 F (37 C)  SpO2: 100% 100%   Vitals:   08/25/21 0334 08/25/21 0802 08/25/21 0823 08/25/21 1246  BP: (!) 161/80 (!) 163/83  (!) 141/76  Pulse: 65 71  90  Resp: 16  20  18  Temp: 97.6 F (36.4 C) (!) 97 F (36.1 C)  98.6 F (37 C)  TempSrc:      SpO2: 100% 100%  100%  Weight:   61.6 kg   Height:        General: Pt is alert, awake, not in acute distress Cardiovascular: RRR, nl S1-S2, no murmurs appreciated.   No LE edema.   Respiratory: Normal respiratory rate and rhythm.  CTAB without rales or wheezes. Abdominal: Abdomen soft and non-tender.  No distension or HSM.   Neuro/Psych: Strength symmetric in upper and lower extremities.  Judgment and insight appear normal, mildly ataxic movements of the hands, speech is slightly slurred, but these are subtle.   The results of significant diagnostics from this hospitalization (including  imaging, microbiology, ancillary and laboratory) are listed below for reference.     Microbiology: Recent Results (from the past 240 hour(s))  Resp Panel by RT-PCR (Flu A&B, Covid) Nasopharyngeal Swab     Status: None   Collection Time: 08/23/21  9:41 PM   Specimen: Nasopharyngeal Swab; Nasopharyngeal(NP) swabs in vial transport medium  Result Value Ref Range Status   SARS Coronavirus 2 by RT PCR NEGATIVE NEGATIVE Final    Comment: (NOTE) SARS-CoV-2 target nucleic acids are NOT DETECTED.  The SARS-CoV-2 RNA is generally detectable in upper respiratory specimens during the acute phase of infection. The lowest concentration of SARS-CoV-2 viral copies this assay can detect is 138 copies/mL. A negative result does not preclude SARS-Cov-2 infection and should not be used as the sole basis for treatment or other patient management decisions. A negative result may occur with  improper specimen collection/handling, submission of specimen other than nasopharyngeal swab, presence of viral mutation(s) within the areas targeted by this assay, and inadequate number of viral copies(<138 copies/mL). A negative result must be combined with clinical observations, patient history, and epidemiological information. The expected result is Negative.  Fact Sheet for Patients:  EntrepreneurPulse.com.au  Fact Sheet for Healthcare Providers:  IncredibleEmployment.be  This test is no t yet approved or cleared by the [place] FDA and  has been authorized for detection and/or diagnosis of SARS-CoV-2 by FDA under an Emergency Use Authorization (EUA). This EUA will remain  in effect (meaning this test can be used) for the duration of the COVID-19 declaration under Section 564(b)(1) of the Act, 21 U.S.C.section 360bbb-3(b)(1), unless the authorization is terminated  or revoked sooner.       Influenza A by PCR NEGATIVE NEGATIVE Final   Influenza B by PCR NEGATIVE  NEGATIVE Final    Comment: (NOTE) The Xpert Xpress SARS-CoV-2/FLU/RSV plus assay is intended as an aid in the diagnosis of influenza from Nasopharyngeal swab specimens and should not be used as a sole basis for treatment. Nasal washings and aspirates are unacceptable for Xpert Xpress SARS-CoV-2/FLU/RSV testing.  Fact Sheet for Patients: EntrepreneurPulse.com.au  Fact Sheet for Healthcare Providers: IncredibleEmployment.be  This test is not yet approved or cleared by the [place] FDA and has been authorized for detection and/or diagnosis of SARS-CoV-2 by FDA under an Emergency Use Authorization (EUA). This EUA will remain in effect (meaning this test can be used) for the duration of the COVID-19 declaration under Section 564(b)(1) of the Act, 21 U.S.C. section 360bbb-3(b)(1), unless the authorization is terminated or revoked.  Performed at [facility], [address] [place], [place] [address]      Labs: BNP (last 3 results) No results for input(s): BNP in the last 8760 hours.  Basic Metabolic Panel: Recent Labs  Lab 08/23/21 1607 08/24/21 0515 08/25/21 0415  NA 127* 133* 135  K 4.1 4.0 3.9  CL 93* 99 99  CO2 23 25 26   GLUCOSE 153* 125* 138*  BUN 20 15 14   CREATININE 0.92 0.93 0.88  CALCIUM 9.7 10.0 9.9   Liver Function Tests: Recent Labs  Lab 08/23/21 1607  AST 22  ALT 17  ALKPHOS 48  BILITOT 1.2  PROT 7.7  ALBUMIN 4.2   No results for input(s): LIPASE, AMYLASE in the last 168 hours. No results for input(s): AMMONIA in the last 168 hours. CBC: Recent Labs  Lab 08/23/21 1607 08/24/21 0515  WBC 7.9 10.5  NEUTROABS 6.1  --   HGB 12.2 12.7  HCT 33.9* 36.2  MCV 90.9 93.8  PLT 322 276   Cardiac Enzymes: No results for input(s): CKTOTAL, CKMB, CKMBINDEX, TROPONINI in the last 168 hours. BNP: Invalid input(s): POCBNP CBG: Recent Labs  Lab 08/24/21 1652 08/24/21 2253 08/25/21 0904 08/25/21 1239   GLUCAP 173* 170* 139* 199*   D-Dimer No results for input(s): DDIMER in the last 72 hours. Hgb A1c Recent Labs    08/24/21 0515  HGBA1C 6.7*   Lipid Profile Recent Labs    08/24/21 0515  CHOL 188  HDL 47  LDLCALC 99  TRIG 208*  CHOLHDL 4.0   Thyroid function studies Recent Labs    08/24/21 0515  TSH 1.131   Anemia work up Recent Labs    08/24/21 0515 08/24/21 1654  VITAMINB12 331  --   FOLATE  --  42.0   Urinalysis    Component Value Date/Time   COLORURINE STRAW (A) 08/23/2021 2032   APPEARANCEUR CLEAR (A) 08/23/2021 2032   LABSPEC 1.006 08/23/2021 2032   PHURINE 6.0 08/23/2021 2032   GLUCOSEU NEGATIVE 08/23/2021 2032   HGBUR NEGATIVE 08/23/2021 2032   BILIRUBINUR NEGATIVE 08/23/2021 2032   KETONESUR NEGATIVE 08/23/2021 2032   PROTEINUR NEGATIVE 08/23/2021 2032   NITRITE NEGATIVE 08/23/2021 2032   LEUKOCYTESUR NEGATIVE 08/23/2021 2032   Sepsis Labs Invalid input(s): PROCALCITONIN,  WBC,  LACTICIDVEN Microbiology Recent Results (from the past 240 hour(s))  Resp Panel by RT-PCR (Flu A&B, Covid) Nasopharyngeal Swab     Status: None   Collection Time: 08/23/21  9:41 PM   Specimen: Nasopharyngeal Swab; Nasopharyngeal(NP) swabs in vial transport medium  Result Value Ref Range Status   SARS Coronavirus 2 by RT PCR NEGATIVE NEGATIVE Final    Comment: (NOTE) SARS-CoV-2 target nucleic acids are NOT DETECTED.  The SARS-CoV-2 RNA is generally detectable in upper respiratory specimens during the acute phase of infection. The lowest concentration of SARS-CoV-2 viral copies this assay can detect is 138 copies/mL. A negative result does not preclude SARS-Cov-2 infection and should not be used as the sole basis for treatment or other patient management decisions. A negative result may occur with  improper specimen collection/handling, submission of specimen other than nasopharyngeal swab, presence of viral mutation(s) within the areas targeted by this assay, and  inadequate number of viral copies(<138 copies/mL). A negative result must be combined with clinical observations, patient history, and epidemiological information. The expected result is Negative.  Fact Sheet for Patients:  EntrepreneurPulse.com.au  Fact Sheet for Healthcare Providers:  IncredibleEmployment.be  This test is no t yet approved or cleared by the [place] FDA and  has been authorized for detection and/or diagnosis of SARS-CoV-2 by FDA under an Emergency Use Authorization (EUA). This EUA will remain  in effect (meaning  this test can be used) for the duration of the COVID-19 declaration under Section 564(b)(1) of the Act, 21 U.S.C.section 360bbb-3(b)(1), unless the authorization is terminated  or revoked sooner.       Influenza A by PCR NEGATIVE NEGATIVE Final   Influenza B by PCR NEGATIVE NEGATIVE Final    Comment: (NOTE) The Xpert Xpress SARS-CoV-2/FLU/RSV plus assay is intended as an aid in the diagnosis of influenza from Nasopharyngeal swab specimens and should not be used as a sole basis for treatment. Nasal washings and aspirates are unacceptable for Xpert Xpress SARS-CoV-2/FLU/RSV testing.  Fact Sheet for Patients: EntrepreneurPulse.com.au  Fact Sheet for Healthcare Providers: IncredibleEmployment.be  This test is not yet approved or cleared by the [place] FDA and has been authorized for detection and/or diagnosis of SARS-CoV-2 by FDA under an Emergency Use Authorization (EUA). This EUA will remain in effect (meaning this test can be used) for the duration of the COVID-19 declaration under Section 564(b)(1) of the Act, 21 U.S.C. section 360bbb-3(b)(1), unless the authorization is terminated or revoked.  Performed at [facility], [address] [place], [place] [address]      Time coordinating discharge: 35 minutes The [place] controlled substances registry was  reviewed for this patient           SIGNED:   Edwin Dada, MD  [facility] 08/25/2021, 1:57 PM

## 2021-08-25 NOTE — Care Management Obs Status (Signed)
[place] NOTIFICATION   Patient Details  Name: Janet Keller MRN: [id number] Date of Birth: 21-Aug-1941   Medicare Observation Status Notification Given:  Yes    Shelbie Hutching, RN 08/25/2021, 11:22 AM

## 2021-08-25 NOTE — Progress Notes (Signed)
*  PRELIMINARY RESULTS* Echocardiogram 2D Echocardiogram has been performed.  Janet Keller 08/25/2021, 10:43 AM

## 2021-08-25 NOTE — Progress Notes (Signed)
Physical Therapy Treatment Patient Details Name: Janet Keller MRN: [id number] DOB: [birth year] Today's Date: 08/25/2021   History of Present Illness 80 y.o. female with medical history significant for  hypertension, non-insulin-dependent diabetes mellitus, hyperlipidemia, GERD, HIV, who presents emergency department for chief concerns of imbalance.    PT Comments    OOB with min a  1 and cues for hand placements.  She is able to stand and walk to bathroom then complete x 1 lap on unit with RW and min guard/supervision.  Gait slow but generally steady.  She remains up in chair at end of session with MD in room.   Encouraged pt to have +1 assist for mobility initially at home and to use RW at all times.  Voiced understanding.  Stated her son may be able to stay with her.   Recommendations for follow up therapy are one component of a multi-disciplinary discharge planning process, led by the attending physician.  Recommendations may be updated based on patient status, additional functional criteria and insurance authorization.  Follow Up Recommendations  Home health PT;Supervision for mobility/OOB     Equipment Recommendations  Rolling walker with 5" wheels;3in1 (PT)    Recommendations for Other Services       Precautions / Restrictions Precautions Precautions: Fall Restrictions Weight Bearing Restrictions: No     Mobility  Bed Mobility Overal bed mobility: Modified Independent                  Transfers Overall transfer level: Needs assistance Equipment used: Standard walker Transfers: Sit to/from Stand Sit to Stand: Min guard;Supervision         General transfer comment: cuing to insure appropriate UE use, able to rise w/o physical assist,  Ambulation/Gait Ambulation/Gait assistance: Supervision;Min guard Gait Distance (Feet): 200 Feet Assistive device: Rolling walker (2 wheeled) Gait Pattern/deviations: Step-through pattern;Decreased step length -  right;Decreased step length - left Gait velocity: decreased       Stairs             Wheelchair Mobility    Modified Rankin (Stroke Patients Only)       Balance Overall balance assessment: Needs assistance Sitting-balance support: No upper extremity supported;Feet unsupported Sitting balance-Leahy Scale: Good     Standing balance support: Bilateral upper extremity supported Standing balance-Leahy Scale: Fair                              Cognition Arousal/Alertness: Awake/alert Behavior During Therapy: WFL for tasks assessed/performed Overall Cognitive Status: Within Functional Limits for tasks assessed                                        Exercises      General Comments        Pertinent Vitals/Pain Pain Assessment: No/denies pain    Home Living                      Prior Function            PT Goals (current goals can now be found in the care plan section) Progress towards PT goals: Progressing toward goals    Frequency    Min 2X/week      PT Plan Current plan remains appropriate    Co-evaluation  AM-PAC PT "6 Clicks" Mobility   Outcome Measure  Help needed turning from your back to your side while in a flat bed without using bedrails?: None Help needed moving from lying on your back to sitting on the side of a flat bed without using bedrails?: A Little Help needed moving to and from a bed to a chair (including a wheelchair)?: A Little Help needed standing up from a chair using your arms (e.g., wheelchair or bedside chair)?: A Little Help needed to walk in hospital room?: A Little Help needed climbing 3-5 steps with a railing? : A Little 6 Click Score: 19    End of Session Equipment Utilized During Treatment: Gait belt Activity Tolerance: Patient tolerated treatment well Patient left: in chair;with chair alarm set;Other (comment) Nurse Communication: Mobility status PT Visit  Diagnosis: Muscle weakness (generalized) (M62.81);Difficulty in walking, not elsewhere classified (R26.2)     Time: 6151-8343 PT Time Calculation (min) (ACUTE ONLY): 13 min  Charges:  $Gait Training: 8-22 mins                    Chesley Noon, PTA 08/25/21, 12:02 PM

## 2021-08-25 NOTE — TOC Initial Note (Signed)
Transition of Care [facility]) - Initial/Assessment Note    Patient Details  Name: Janet Keller MRN: [id number] Date of Birth: 08/07/1941  Transition of [facility]) CM/SW Contact:    Shelbie Hutching, RN Phone Number: 08/25/2021, 12:48 PM  Clinical Narrative:                 Patient placed under observation for dizziness, slurred speech and hyponatremia.  RNCM met with patient at the bedside to discuss discharge planning.  Patient reports she is from home where she lives alone.  Patient's son lives close by and will be able to pick her up at discharge.  Patient should be able to DC home this afternoon after ECHO read per MD.  Patient agrees to Home health PT.  [place] accepted referral for [facility] PT.  Patient needs a walker and [employer] has delivered to the bedside.  Patient drives and is independent at home.  She is current with Dr. Edwina Barth.     Expected Discharge Plan: [place] Barriers to Discharge: Continued Medical Work up   Patient Goals and CMS Choice Patient states their goals for this hospitalization and ongoing recovery are:: Patient agrees to home with home health CMS Medicare.gov Compare Post Acute Care list provided to:: Patient Choice offered to / list presented to : Patient  Expected Discharge Plan and Services Expected Discharge Plan: [place]   Discharge Planning Services: CM Consult Post Acute Care Choice: [place] arrangements for the past 2 months: Single Family Home                 DME Arranged: Walker rolling DME Agency: [employer] Date DME Agency Contacted: 08/25/21 Time DME Agency Contacted: [id number] Representative spoke with at DME Agency: [place]: PT [place]: [place] Date [place]: 08/25/21 Time [place]: 1107 Representative spoke with at [place]: [place]  Prior Living Arrangements/Services Living arrangements for the past 2 months: [place]  with:: Self Patient language and need for interpreter reviewed:: Yes Do you feel safe going back to the place where you live?: Yes      Need for Family Participation in Patient Care: Yes (Comment) Care giver support system in place?: Yes (comment) (son)   Criminal Activity/Legal Involvement Pertinent to Current Situation/Hospitalization: No - Comment as needed  Activities of Daily Living Home Assistive Devices/Equipment: Eyeglasses, Dentures (specify type) ADL Screening (condition at time of admission) Patient's cognitive ability adequate to safely complete daily activities?: Yes Is the patient deaf or have difficulty hearing?: No Does the patient have difficulty seeing, even when wearing glasses/contacts?: No Does the patient have difficulty concentrating, remembering, or making decisions?: No Patient able to express need for assistance with ADLs?: Yes Does the patient have difficulty dressing or bathing?: No Independently performs ADLs?: No Communication: Independent Dressing (OT): Independent Grooming: Independent Feeding: Independent Bathing: Independent Toileting: Independent In/Out Bed: Needs assistance Walks in Home: Needs assistance Does the patient have difficulty walking or climbing stairs?: Yes Weakness of Legs: None Weakness of Arms/Hands: None  Permission Sought/Granted Permission sought to share information with : Case Manager, Family Supports Permission granted to share information with : Yes, Verbal Permission Granted  Share Information with NAME: Darra Lis  Permission granted to share info w AGENCY: Home health agencies  Permission granted to share info w Relationship: son     Emotional Assessment Appearance:: Appears stated age Attitude/Demeanor/Rapport: Engaged Affect (typically observed): Accepting Orientation: : Oriented  to Self, Oriented to Place, Oriented to  Time, Oriented to Situation Alcohol / Substance Use: Not Applicable Psych Involvement: No  (comment)  Admission diagnosis:  Dehydration [E86.0] TIA (transient ischemic attack) [G45.9] Dizziness [R42] Slurred speech [R47.81] Hyponatremia [E87.1] Patient Active Problem List   Diagnosis Date Noted   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 [place] [address] Phone: [phone] Fax: [phone]     Social Determinants of Health (SDOH) Interventions    Readmission Risk Interventions No flowsheet data found.

## 2021-08-25 NOTE — Progress Notes (Signed)
Pt being discharged home, discharge instructions reviewed with pt, states understanding, pt with no complaints, awaiting son for transport

## 2021-08-25 NOTE — Progress Notes (Signed)
Neurology Progress Note  S: Patient is feeling well this AM and looking forward to going home. Sx stable, no new neuro deficits.  Interval events: Upon further review of her MRI images and discussion with vascular neurology and neuroradiology we feel that her MRI abnormality reflects bilateral pontine infarcts, thus creating the appearance that the lesion crosses midline. This is unusual but not unheard of. It is likely 2/2 small vessel disease, less likely to be embolic although potentially something could have flicked off the occluded R vertebral to cause this. TTE was ordered to complete stroke w/u which showed no significant abnl and no intracardiac clot. Patient will be discharged home on the below medication regimen with close outpatient f/u in neurology clinic.  O:  Vitals:   08/25/21 0802 08/25/21 1246  BP: (!) 163/83 (!) 141/76  Pulse: 71 90  Resp: 20 18  Temp: (!) 97 F (36.1 C) 98.6 F (37 C)  SpO2: 100% 100%   Physical Exam Gen: A&O x4, NAD HEENT: Atraumatic, normocephalic;mucous membranes moist; oropharynx clear, tongue without atrophy or fasciculations. Neck: Supple, trachea midline. Resp: CTAB, no w/r/r CV: RRR, no m/g/r; nml S1 and S2. 2+ symmetric peripheral pulses. Abd: soft/NT/ND; nabs x 4 quad Extrem: Nml bulk; no cyanosis, clubbing, or edema.   Neuro: *MS: A&O x4. Follows multi-step commands.  *Speech: fluid, nondysarthric, able to name and repeat *CN:    I: Deferred   II,III: PERRLA, VFF by confrontation, optic discs not visualized 2/2 pupillary constriction   III,IV,VI: EOMI w/o nystagmus, no ptosis   V: Sensation intact from V1 to V3 to LT   VII: Eyelid closure was full.  Smile symmetric.   VIII: Hearing intact to voice   IX,X: Voice normal, palate elevates symmetrically    XI: SCM/trap 5/5 bilat   XII: Tongue protrudes midline, no atrophy or fasciculations    *Motor:   Normal bulk.  No tremor, rigidity or bradykinesia. No pronator drift.      Strength: Dlt Bic Tri WrE WrF FgS Gr HF KnF KnE PlF DoF    Left 5 5 4+ 5 5 5  4+ 4+ 4+ 5 5 5     Right 5 5 5 5 5 5 5 5 5 5 5 5       *Sensory: Intact to light touch, pinprick, temperature vibration throughout. Symmetric. Propioception intact bilat.  No double-simultaneous extinction. Romberg is mildly (+) *Coordination:  Finger-to-nose, heel-to-shin, rapid alternating motions were intact. *Reflexes:  2+ and symmetric throughout without clonus; toes down-going bilat *Gait: normal base, short stride, slightly unsteady but does not stumble.   NIHSS components Score: Comment  1a Level of Conscious 0[x]  1[]  2[]  3[]      1b LOC Questions 0[x]  1[]  2[]       1c LOC Commands 0[x]  1[]  2[]       2 Best Gaze 0[x]  1[]  2[]       3 Visual 0[x]  1[]  2[]  3[]      4 Facial Palsy 0[x]  1[]  2[]  3[]      5a Motor Arm - left 0[]  1[x]  2[]  3[]  4[]  UN[]    5b Motor Arm - Right 0[x]  1[]  2[]  3[]  4[]  UN[]    6a Motor Leg - Left 0[]  1[x]  2[]  3[]  4[]  UN[]    6b Motor Leg - Right 0[x]  1[]  2[]  3[]  4[]  UN[]    7 Limb Ataxia 0[x]  1[]  2[]  3[]  UN[]     8 Sensory 0[x]  1[]  2[]  UN[]      9 Best Language 0[x]  1[]  2[]  3[]      10 Dysarthria  0[x]  1[]  2[]  UN[]      11 Extinct. and Inattention 0[x]  1[]  2[]       TOTAL: 2      Lab Results  Component Value Date   LDLCALC 99 08/24/2021   Lab Results  Component Value Date   HGBA1C 6.7 (H) 08/24/2021   Imaging this admission:  MRI brain wo contrast Restricted diffusion in midline pons, most characteristic of osmotic demyelination syndrome, but in absence of history c/w that diagnosis, may rarely represent acute ischemic bilateral pontine infarcts.  MRI brain w contrast No abnormal enhancement of the pons corresponding to the signal abnormality seen on the MRI from 1 day prior; neoplasm is unlikely.  MRA Head No significant intracranial stenosis  MRA Neck Occlusion R vertebral artery. L vert normal to the skull base.  There is an enhancement defect within the proximal left internal  carotid artery, which is otherwise normal. Enhancement defect may indicate occlusion vs high-grade stenosis.  CTA H&N No intracranial arterial occlusion or high-grade stenosis. Occlusion of R vertebral along its entire length. No intraluminal thrombus, high-grade stenosis, or occlusion seen in the L ICA. Otherwise unremarkable. 1. No intracranial arterial occlusion or high-grade stenosis. 2. Occlusion of the right vertebral artery along its entire length.  CNS imaging personally reviewed  A/P: This is a 80 yo woman with pmhx significant HIV (last VL<20 on 08/31/20 and cd4 638 on Descovy and tivicay), DM2, HTN, HL, MALT lymphoma who presented to ED on 08/23/21 c/o 3 days of slurred speech, lightheadedness (but not room spinning) and feeling off balance.  MRI brain showed T2/FLAIR hyperintensity in the pons that crosses midline but does not enhance, a finding typically associated with central pontine myelinolysis, although patient gave no hx consistent with ODS. After further review and discussion with vascular radiology and neuroradiology we feel that this is most likely a bilateral pontine stroke per above.  - Patient can be discharged home from neuro standpoint if cleared by PT (likely HHPT) - Atorvastatin 80mg  daily - ASA 81mg  daily + plavix 75mg  daily x21 days f/b clopidogrel 75mg  daily monotherapy after that (she was on aspirin prior to the stroke, this would be considered an aspirin failure - I will arrange close f/u in outpatient neurology clinic  Neurology to sign off, please re-engage if additional questions arise.  Su Monks, MD [facility] [phone]  If 7pm- 7am, please page neurology on call as listed in [place].

## 2021-08-26 DIAGNOSIS — E785 Hyperlipidemia, unspecified: Secondary | ICD-10-CM | POA: Insufficient documentation

## 2021-08-26 DIAGNOSIS — N179 Acute kidney failure, unspecified: Secondary | ICD-10-CM | POA: Insufficient documentation

## 2021-08-26 DIAGNOSIS — I639 Cerebral infarction, unspecified: Secondary | ICD-10-CM | POA: Insufficient documentation

## 2021-08-26 DIAGNOSIS — I10 Essential (primary) hypertension: Secondary | ICD-10-CM | POA: Insufficient documentation

## 2021-08-26 DIAGNOSIS — R2689 Other abnormalities of gait and mobility: Secondary | ICD-10-CM | POA: Insufficient documentation

## 2021-08-26 DIAGNOSIS — E119 Type 2 diabetes mellitus without complications: Secondary | ICD-10-CM | POA: Insufficient documentation

## 2021-08-26 DIAGNOSIS — Y92009 Unspecified place in unspecified non-institutional (private) residence as the place of occurrence of the external cause: Secondary | ICD-10-CM | POA: Insufficient documentation

## 2021-08-26 DIAGNOSIS — W19XXXA Unspecified fall, initial encounter: Secondary | ICD-10-CM | POA: Insufficient documentation

## 2021-08-26 LAB — HELPER T-LYMPH-CD4 (ARMC ONLY)
% CD 4 Pos. Lymph.: 32.4 % (ref 30.8–58.5)
Absolute CD 4 Helper: 1037 /uL (ref 359–1519)
Basophils Absolute: 0 10*3/uL (ref 0.0–0.2)
Basos: 1 %
EOS (ABSOLUTE): 0.1 10*3/uL (ref 0.0–0.4)
Eos: 1 %
Hematocrit: 35.2 % (ref 34.0–46.6)
Hemoglobin: 12 g/dL (ref 11.1–15.9)
Immature Grans (Abs): 0 10*3/uL (ref 0.0–0.1)
Immature Granulocytes: 0 %
Lymphocytes Absolute: 3.2 10*3/uL — ABNORMAL HIGH (ref 0.7–3.1)
Lymphs: 44 %
MCH: 31.3 pg (ref 26.6–33.0)
MCHC: 34.1 g/dL (ref 31.5–35.7)
MCV: 92 fL (ref 79–97)
Monocytes Absolute: 0.4 10*3/uL (ref 0.1–0.9)
Monocytes: 6 %
Neutrophils Absolute: 3.6 10*3/uL (ref 1.4–7.0)
Neutrophils: 48 %
Platelets: 299 10*3/uL (ref 150–450)
RBC: 3.84 x10E6/uL (ref 3.77–5.28)
RDW: 13.8 % (ref 11.7–15.4)
WBC: 7.4 10*3/uL (ref 3.4–10.8)

## 2021-08-26 LAB — RPR: RPR Ser Ql: NONREACTIVE

## 2021-08-26 LAB — CERULOPLASMIN: Ceruloplasmin: 22.5 mg/dL (ref 19.0–39.0)

## 2021-08-27 LAB — HEAVY METALS, BLOOD
Arsenic: 3 ug/L (ref 0–9)
Lead: <1 ug/dL (ref 0–4)
Mercury: <1 ug/L (ref 0.0–14.9)

## 2021-08-27 LAB — HIV-1 RNA QUANT-NO REFLEX-BLD
HIV 1 RNA Quant: 80 copies/mL
LOG10 HIV-1 RNA: 1.903 log10copy/mL

## 2021-08-27 LAB — COPPER, SERUM: Copper: 99 ug/dL (ref 80–158)

## 2021-08-29 LAB — VITAMIN B1: Vitamin B1 (Thiamine): 101.1 nmol/L (ref 66.5–200.0)

## 2021-09-29 ENCOUNTER — Other Ambulatory Visit: Payer: Self-pay

## 2021-09-29 ENCOUNTER — Telehealth: Payer: Self-pay | Admitting: Infectious Diseases

## 2021-09-29 DIAGNOSIS — B20 Human immunodeficiency virus [HIV] disease: Secondary | ICD-10-CM

## 2021-09-29 MED ORDER — EMTRICITABINE-TENOFOVIR AF 200-25 MG PO TABS: 1.0000 | ORAL_TABLET | Freq: Every day | ORAL | 1 refills | Status: DC

## 2021-09-29 MED ORDER — TIVICAY 50 MG PO TABS: 50.0000 mg | ORAL_TABLET | Freq: Every day | ORAL | 1 refills | Status: DC

## 2021-09-29 NOTE — Telephone Encounter (Signed)
Pt called Asking for a Refill of Tivicay, you can reach her at [phone]

## 2021-11-03 ENCOUNTER — Ambulatory Visit: Payer: Medicare HMO | Attending: Infectious Diseases | Admitting: Infectious Diseases

## 2021-11-03 ENCOUNTER — Telehealth: Payer: Self-pay

## 2021-11-03 ENCOUNTER — Other Ambulatory Visit: Payer: Self-pay

## 2021-11-03 DIAGNOSIS — Z8673 Personal history of transient ischemic attack (TIA), and cerebral infarction without residual deficits: Secondary | ICD-10-CM | POA: Insufficient documentation

## 2021-11-03 DIAGNOSIS — Z09 Encounter for follow-up examination after completed treatment for conditions other than malignant neoplasm: Secondary | ICD-10-CM | POA: Insufficient documentation

## 2021-11-03 DIAGNOSIS — Z7902 Long term (current) use of antithrombotics/antiplatelets: Secondary | ICD-10-CM | POA: Insufficient documentation

## 2021-11-03 DIAGNOSIS — I10 Essential (primary) hypertension: Secondary | ICD-10-CM | POA: Diagnosis not present

## 2021-11-03 DIAGNOSIS — E1140 Type 2 diabetes mellitus with diabetic neuropathy, unspecified: Secondary | ICD-10-CM | POA: Diagnosis not present

## 2021-11-03 DIAGNOSIS — Z79899 Other long term (current) drug therapy: Secondary | ICD-10-CM | POA: Diagnosis not present

## 2021-11-03 DIAGNOSIS — Z87891 Personal history of nicotine dependence: Secondary | ICD-10-CM | POA: Insufficient documentation

## 2021-11-03 DIAGNOSIS — E785 Hyperlipidemia, unspecified: Secondary | ICD-10-CM | POA: Insufficient documentation

## 2021-11-03 DIAGNOSIS — H9192 Unspecified hearing loss, left ear: Secondary | ICD-10-CM | POA: Insufficient documentation

## 2021-11-03 DIAGNOSIS — Z7984 Long term (current) use of oral hypoglycemic drugs: Secondary | ICD-10-CM | POA: Insufficient documentation

## 2021-11-03 DIAGNOSIS — B20 Human immunodeficiency virus [HIV] disease: Secondary | ICD-10-CM | POA: Insufficient documentation

## 2021-11-03 MED ORDER — TIVICAY 50 MG PO TABS: 50.0000 mg | ORAL_TABLET | Freq: Every day | ORAL | 6 refills | Status: DC

## 2021-11-03 MED ORDER — EMTRICITABINE-TENOFOVIR AF 200-25 MG PO TABS: 1.0000 | ORAL_TABLET | Freq: Every day | ORAL | 6 refills | Status: DC

## 2021-11-03 NOTE — Patient Instructions (Addendum)
You are here for follow up of HIV- you are doing well- with high cd4 and Vl < 200. Continue taking descovy and tivicay. Will do labs in 6 months when you have folow up Please follow up with PCP, neurologist

## 2021-11-03 NOTE — Telephone Encounter (Signed)
Patient left voicemail stating she received flu vaccine 08/11/21 and COVID booster 08/16/21.  Beryle Flock, RN
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
## 2021-12-26 ENCOUNTER — Other Ambulatory Visit: Payer: Self-pay

## 2021-12-26 ENCOUNTER — Ambulatory Visit (INDEPENDENT_AMBULATORY_CARE_PROVIDER_SITE_OTHER): Payer: Medicare HMO | Admitting: Vascular Surgery

## 2021-12-26 ENCOUNTER — Encounter (INDEPENDENT_AMBULATORY_CARE_PROVIDER_SITE_OTHER): Payer: Self-pay | Admitting: Vascular Surgery

## 2021-12-26 VITALS — BP 186/89 | HR 71 | Resp 17 | Ht 64.0 in | Wt 130.0 lb

## 2021-12-26 DIAGNOSIS — I10 Essential (primary) hypertension: Secondary | ICD-10-CM | POA: Diagnosis not present

## 2021-12-26 DIAGNOSIS — E1159 Type 2 diabetes mellitus with other circulatory complications: Secondary | ICD-10-CM

## 2021-12-26 DIAGNOSIS — I6501 Occlusion and stenosis of right vertebral artery: Secondary | ICD-10-CM | POA: Diagnosis not present

## 2021-12-26 DIAGNOSIS — E782 Mixed hyperlipidemia: Secondary | ICD-10-CM

## 2021-12-26 DIAGNOSIS — I6509 Occlusion and stenosis of unspecified vertebral artery: Secondary | ICD-10-CM | POA: Insufficient documentation

## 2021-12-26 NOTE — Progress Notes (Signed)
MRN : [id number]  Janet Keller is a 81 y.o. (1941-09-05) female who presents with chief complaint of had a CVA.  History of Present Illness:  I am asked to evaluate the patient by Dr. Manuella Ghazi for right vertebral artery occlusion.  The patient is seen for evaluation of vertebral artery occlusion. The vertebral stenosis was identified after both CTA and MRA in 07/2021.  She was admitted to [facility] at that time for stroke symptoms.  The patient denies recent amaurosis fugax. There is no recent history of TIA symptoms or focal motor deficits. There is a prior documented CVA.  There is no history of migraine headaches. There is no history of seizures.  The patient is taking enteric-coated aspirin 81 mg daily with Plavix 75 mg daily and Lipitor.  The patient does not have a history of coronary artery disease, no recent episodes of angina or shortness of breath. The patient denies PAD or claudication symptoms. There is a history of hyperlipidemia which is being treated with a statin.    I have personally reviewed both the CT angiogram as well as the MRA and concur there is less than 30% stenosis of the internal carotid arteries bilaterally.  Left vertebral artery is widely patent as is the basilar right vertebral artery is occluded.  Current Meds  Medication Sig   acetaminophen (TYLENOL) 500 MG tablet Take 500 mg by mouth every 6 (six) hours as needed.   acetic acid-hydrocortisone (VOSOL-HC) OTIC solution Place 4 drops in ear(s) 2 (two) times a week.   aspirin EC 81 MG tablet Take 81 mg by mouth daily.   atorvastatin (LIPITOR) 40 MG tablet Take 1 tablet (40 mg total) by mouth daily.   Calcium Carbonate (CALCIUM-CARB 600 PO) Take 2 tablets by mouth daily.   clopidogrel (PLAVIX) 75 MG tablet Take 1 tablet (75 mg total) by mouth daily.   clotrimazole-betamethasone (LOTRISONE) cream Apply topically 2 (two) times daily.   dolutegravir (TIVICAY) 50 MG tablet Take 1 tablet (50 mg total) by mouth  daily.   emtricitabine-tenofovir AF (DESCOVY) 200-25 MG tablet Take 1 tablet by mouth daily.   enalapril (VASOTEC) 10 MG tablet Take 2 tablets by mouth 2 (two) times daily.   estradiol (ESTRACE) 0.1 MG/GM vaginal cream Place 0.5 application vaginally once a week.   Fluocinolone Acetonide 0.01 % OIL Place 2 drops in ear(s) at bedtime.   fluticasone (FLONASE) 50 MCG/ACT nasal spray Place into the nose.   hydrocortisone 2.5 % cream Apply 1 application topically in the morning and at bedtime.   metFORMIN (GLUCOPHAGE) 500 MG tablet Take 1 tablet by mouth 2 (two) times daily with a meal.   metoprolol tartrate (LOPRESSOR) 25 MG tablet Take 25 mg by mouth 2 (two) times daily.    Multiple Vitamins-Minerals (MULTIVITAMIN ADULT PO) Take 1 tablet by mouth daily.   nystatin ointment (MYCOSTATIN) Apply 1 application topically 2 (two) times daily as needed (skin irritation).    ONETOUCH VERIO test strip 2 (two) times daily.   Polyethyl Glycol-Propyl Glycol (SYSTANE) 0.4-0.3 % SOLN Apply 1 drop to eye daily as needed.    Past Medical History:  Diagnosis Date   Arthritis    osteoarthritis   Breast pain    Diabetes mellitus without complication (HCC)    GERD (gastroesophageal reflux disease)    HIV (human immunodeficiency virus infection) ([place])    Hyperlipidemia    Hypertension    MALT lymphoma ([place])     Past Surgical History:  Procedure Laterality  Date   BREAST BIOPSY Left 1985   neg   CATARACT EXTRACTION W/ INTRAOCULAR LENS  IMPLANT, BILATERAL     HAMMER TOE SURGERY Bilateral 02/01/2018   Procedure: HAMMER TOE CORRECTION/Bil 5TH toes;  Surgeon: Sharlotte Alamo, DPM;  Location: [facility] ORS;  Service: Podiatry;  Laterality: Bilateral;   HYSTEROSCOPY WITH D & C N/A 02/18/2016   Procedure: Fractional DILATATION AND CURETTAGE /HYSTEROSCOPY , Polypectomy and myomectomy;  Surgeon: Benjaman Kindler, MD;  Location: [facility] ORS;  Service: Gynecology;  Laterality: N/A;   TUBAL LIGATION      Social History Social  History   Tobacco Use   Smoking status: Former   Smokeless tobacco: Never  Scientific laboratory technician Use: Never used  Substance Use Topics   Alcohol use: Yes    Alcohol/week: 1.0 standard drink    Types: 1 Glasses of wine per week    Comment: previous ETOH abuse   Drug use: No    Family History Family History  Problem Relation Age of Onset   Breast cancer Mother 38    No Known Allergies   REVIEW OF SYSTEMS (Negative unless checked)  Constitutional: [] Weight loss  [] Fever  [] Chills Cardiac: [] Chest pain   [] Chest pressure   [] Palpitations   [] Shortness of breath when laying flat   [] Shortness of breath with exertion. Vascular:  [] Pain in legs with walking   [] Pain in legs at rest  [] History of DVT   [] Phlebitis   [] Swelling in legs   [] Varicose veins   [] Non-healing ulcers Pulmonary:   [] Uses home oxygen   [] Productive cough   [] Hemoptysis   [] Wheeze  [] COPD   [] Asthma Neurologic:  [] Dizziness   [] Seizures   [x] History of stroke   [] History of TIA  [] Aphasia   [] Vissual changes   [] Weakness or numbness in arm   [] Weakness or numbness in leg Musculoskeletal:   [] Joint swelling   [] Joint pain   [] Low back pain Hematologic:  [] Easy bruising  [] Easy bleeding   [] Hypercoagulable state   [] Anemic Gastrointestinal:  [] Diarrhea   [] Vomiting  [] Gastroesophageal reflux/heartburn   [] Difficulty swallowing. Genitourinary:  [] Chronic kidney disease   [] Difficult urination  [] Frequent urination   [] Blood in urine Skin:  [] Rashes   [] Ulcers  Psychological:  [] History of anxiety   []  History of major depression.  Physical Examination  Vitals:   12/26/21 1131  BP: (!) 186/89  Pulse: 71  Resp: 17  Weight: 130 lb (59 kg)  Height: 5\' 4"  (1.626 m)   Body mass index is 22.31 kg/m. Gen: WD/WN, NAD Head: [place]/AT, No temporalis wasting.  Ear/Nose/Throat: Hearing grossly intact, nares w/o erythema or drainage Eyes: PER, EOMI, sclera nonicteric.  Neck: Supple, no masses.  No bruit or JVD.   Pulmonary:  Good air movement, no audible wheezing, no use of accessory muscles.  Cardiac: RRR, normal S1, S2, no Murmurs. Vascular:   no carotid bruits noted Vessel Right Left  Radial Palpable Palpable  Carotid Palpable Palpable  Gastrointestinal: soft, non-distended. No guarding/no peritoneal signs.  Musculoskeletal: M/S 5/5 throughout.  No visible deformity.  Neurologic: CN 2-12 intact. Pain and light touch intact in extremities.  Symmetrical.  Speech is fluent. Motor exam as listed above. Psychiatric: Judgment intact, Mood & affect appropriate for pt's clinical situation. Dermatologic: No rashes or ulcers noted.  No changes consistent with cellulitis.   CBC Lab Results  Component Value Date   WBC 7.4 08/25/2021   HGB 12.0 08/25/2021   HCT 35.2 08/25/2021   MCV  92 08/25/2021   PLT 299 08/25/2021    BMET    Component Value Date/Time   NA 135 08/25/2021 0415   K 3.9 08/25/2021 0415   CL 99 08/25/2021 0415   CO2 26 08/25/2021 0415   GLUCOSE 138 (H) 08/25/2021 0415   BUN 14 08/25/2021 0415   CREATININE 0.88 08/25/2021 0415   CALCIUM 9.9 08/25/2021 0415   GFRNONAA >60 08/25/2021 0415   GFRAA >60 02/26/2020 1026   CrCl cannot be calculated (Patient's most recent lab result is older than the maximum 21 days allowed.).  COAG Lab Results  Component Value Date   INR 0.9 08/23/2021   INR 1.02 02/10/2016    Radiology No results found.   Assessment/Plan 1. Occlusion and stenosis of right vertebral artery Recommend:  Given the patient's asymptomatic subcritical carotid stenosis in association with the widely patent left vertebral and an occluded right vertebral no further invasive testing or surgery at this time.  Continue antiplatelet therapy as prescribed Continue management of CAD, HTN and Hyperlipidemia Healthy heart diet,  encouraged exercise at least 4 times per week Follow up in 6 months with duplex ultrasound and physical exam   - VAS US CAROTID;  Future  2. Benign essential hypertension Continue antihypertensive medications as already ordered, these medications have been reviewed and there are no changes at this time.   3. Type 2 diabetes mellitus with other circulatory complication, unspecified whether long term insulin use (HCC) Continue hypoglycemic medications as already ordered, these medications have been reviewed and there are no changes at this time.  Hgb A1C to be monitored as already arranged by primary service   4. Mixed hyperlipidemia Continue statin as ordered and reviewed, no changes at this time     Hortencia Pilar, MD  12/26/2021 11:34 AM

## 2022-01-04 ENCOUNTER — Telehealth: Payer: Self-pay

## 2022-01-04 NOTE — Telephone Encounter (Signed)
Patient called office today requesting fax number for [facility] ID to have forms faxed and reviewed. Left voicemail with office fax number.  Leatrice Jewels, RMA

## 2022-01-09 ENCOUNTER — Emergency Department: Payer: Medicare HMO

## 2022-01-09 ENCOUNTER — Other Ambulatory Visit: Payer: Self-pay

## 2022-01-09 ENCOUNTER — Emergency Department
Admission: EM | Admit: 2022-01-09 | Discharge: 2022-01-09 | Disposition: A | Discharge status: Home / Self Care | Payer: Medicare HMO | Attending: Emergency Medicine | Admitting: Emergency Medicine

## 2022-01-09 DIAGNOSIS — R42 Dizziness and giddiness: Secondary | ICD-10-CM | POA: Diagnosis present

## 2022-01-09 DIAGNOSIS — D649 Anemia, unspecified: Secondary | ICD-10-CM | POA: Diagnosis not present

## 2022-01-09 DIAGNOSIS — E871 Hypo-osmolality and hyponatremia: Secondary | ICD-10-CM | POA: Diagnosis not present

## 2022-01-09 DIAGNOSIS — R7309 Other abnormal glucose: Secondary | ICD-10-CM | POA: Diagnosis not present

## 2022-01-09 DIAGNOSIS — Z20822 Contact with and (suspected) exposure to covid-19: Secondary | ICD-10-CM | POA: Diagnosis not present

## 2022-01-09 DIAGNOSIS — J019 Acute sinusitis, unspecified: Secondary | ICD-10-CM | POA: Diagnosis not present

## 2022-01-09 DIAGNOSIS — Z21 Asymptomatic human immunodeficiency virus [HIV] infection status: Secondary | ICD-10-CM | POA: Insufficient documentation

## 2022-01-09 LAB — COMPREHENSIVE METABOLIC PANEL
ALT: 11 U/L (ref 0–44)
AST: 30 U/L (ref 15–41)
Albumin: 3.9 g/dL (ref 3.5–5.0)
Alkaline Phosphatase: 58 U/L (ref 38–126)
Anion gap: 10 (ref 5–15)
BUN: 12 mg/dL (ref 8–23)
CO2: 22 mmol/L (ref 22–32)
Calcium: 9.5 mg/dL (ref 8.9–10.3)
Chloride: 102 mmol/L (ref 98–111)
Creatinine, Ser: 0.76 mg/dL (ref 0.44–1.00)
GFR, Estimated: >60 mL/min (ref >60)
Glucose, Bld: 198 mg/dL — ABNORMAL HIGH (ref 70–99)
Potassium: 4.4 mmol/L (ref 3.5–5.1)
Sodium: 134 mmol/L — ABNORMAL LOW (ref 135–145)
Total Bilirubin: 1.4 mg/dL — ABNORMAL HIGH (ref 0.3–1.2)
Total Protein: 7.3 g/dL (ref 6.5–8.1)

## 2022-01-09 LAB — CBC
HCT: 35.1 % — ABNORMAL LOW (ref 36.0–46.0)
Hemoglobin: 11.7 g/dL — ABNORMAL LOW (ref 12.0–15.0)
MCH: 31 pg (ref 26.0–34.0)
MCHC: 33.3 g/dL (ref 30.0–36.0)
MCV: 92.9 fL (ref 80.0–100.0)
Platelets: 300 10*3/uL (ref 150–400)
RBC: 3.78 MIL/uL — ABNORMAL LOW (ref 3.87–5.11)
RDW: 13.8 % (ref 11.5–15.5)
WBC: 5.9 10*3/uL (ref 4.0–10.5)
nRBC: 0 % (ref 0.0–0.2)

## 2022-01-09 LAB — CBG MONITORING, ED: Glucose-Capillary: 216 mg/dL — ABNORMAL HIGH (ref 70–99)

## 2022-01-09 LAB — TROPONIN I (HIGH SENSITIVITY): Troponin I (High Sensitivity): 9 ng/L (ref <18)

## 2022-01-09 MED ORDER — LORAZEPAM 0.5 MG PO TABS
0.5000 mg | ORAL_TABLET | Freq: Once | ORAL | Status: AC
  Administered 2022-01-09: 0.5 mg via ORAL
  Filled 2022-01-09: qty 1

## 2022-01-09 MED ORDER — AMOXICILLIN-POT CLAVULANATE 875-125 MG PO TABS: 1.0000 | ORAL_TABLET | Freq: Two times a day (BID) | ORAL | 0 refills | Status: AC

## 2022-01-09 MED ORDER — LORAZEPAM 0.5 MG PO TABS
0.5000 mg | ORAL_TABLET | Freq: Once | ORAL | Status: DC
  Filled 2022-01-09: qty 1

## 2022-01-09 MED ORDER — LORAZEPAM 2 MG/ML IJ SOLN: 0.5000 mg | Freq: Once | INTRAMUSCULAR | Status: DC

## 2022-01-09 NOTE — Discharge Instructions (Addendum)
We are starting you on a course of antibiotics for possible sinusitis.  She should call an ENT to get follow-up for your abnormal MRI scan below.  Get over-the-counter eardrops to help with wax.  Your MRI was negative for any signs of stroke.  If he develops return of symptoms or worsening symptoms please return to the ER for repeat evaluation   IMPRESSION: 1. No acute finding. 2. Chronic small vessel disease. 3. Chronic right mastoid and middle ear opacification.

## 2022-01-09 NOTE — ED Notes (Signed)
Pt wandering around in the hall. Pt spoke with the son and Dr. Jari Pigg talked to the son and pt states she would be agreeable to the MRI at this point. Son also helped explained that because her sugar was fine yesterday that we wanted to make sure nothing else caused her unsteadiness

## 2022-01-09 NOTE — ED Notes (Signed)
First Nurse RN and Medic student attempted multiple times for an IV, unsuccessful attempt. Labs were sent at this time

## 2022-01-09 NOTE — ED Provider Notes (Signed)
[facility] Provider Note    Event Date/Time   First MD Initiated Contact with Patient 01/09/22 [phone]     (approximate)   History   Blood Sugar Problem   HPI  Janet Keller is a 81 y.o. female  with HIV, prior stroke, who comes in with concerns for issues of her blood sugar.  Patient reports that yesterday she was checking her sugar and she noted it was 101.  She states that her normal sugar is 140-180.  She reports that she has been compliant with her metformin and had a good healthy dinner prior to this so she was unsure why her sugar was low.  She reports that she did eat some candy and drink something and then she was able to go to bed.  However prior to going to bed she reported that she felt off balance with walking and felt a little dizzy.  She reports the symptoms have mostly since resolved but she was worried about her sugar so she came in today to be evaluated      Physical Exam   Triage Vital Signs: ED Triage Vitals  Enc Vitals Group     BP 01/09/22 0939 (!) 201/85     Pulse Rate 01/09/22 0939 61     Resp 01/09/22 0939 18     Temp 01/09/22 0939 98 F (36.7 C)     Temp src --      SpO2 01/09/22 0939 95 %     Weight --      Height --      Head Circumference --      Peak Flow --      Pain Score 01/09/22 0932 0     Pain Loc --      Pain Edu? --      Excl. in [place]? --     Most recent vital signs: Vitals:   01/09/22 0939  BP: (!) 201/85  Pulse: 61  Resp: 18  Temp: 98 F (36.7 C)  SpO2: 95%     General: Awake, no distress.  CV:  Good peripheral perfusion.  Resp:  Normal effort.  Abd:  No distention.  Other:  Equal strength in arms and legs.  Cranial nerves appear intact.  Finger-to-nose intact bilaterally.  Ambulating without any issues.   ED Results / Procedures / Treatments   Labs (all labs ordered are listed, but only abnormal results are displayed) Labs Reviewed  CBG MONITORING, ED - Abnormal; Notable for the  following components:      Result Value   Glucose-Capillary 216 (*)    All other components within normal limits  BASIC METABOLIC PANEL  CBC  URINALYSIS, ROUTINE W REFLEX MICROSCOPIC  CBG MONITORING, ED  CBG MONITORING, ED     EKG  My interpretation of EKG:  Sinus bradycardia rate of 58 without any ST elevation, T wave inversion in V4 through V6, aVF  Prior ekg similar twave inversions    RADIOLOGY I have reviewed the MRI personally and waiting for rads read.    PROCEDURES:  Critical Care performed: No  .1-3 Lead EKG Interpretation Performed by: Vanessa Geneva, MD Authorized by: Vanessa [place], MD     Interpretation: normal     ECG rate:  60   ECG rate assessment: normal     Rhythm: sinus rhythm     Ectopy: none     Conduction: normal     MEDICATIONS ORDERED IN ED: Medications  LORazepam (  ATIVAN) tablet 0.5 mg (0.5 mg Oral Given 01/09/22 1215)     IMPRESSION / MDM / ASSESSMENT AND PLAN / ED COURSE  I reviewed the triage vital signs and the nursing notes.   Differential diagnosis includes, but is not limited to, dehydration, electrolyte abnormalities, UTI, cardiac, stroke.  Discussed with patient that her sugar of 100 most likely was not causing her to feel off balance just given she normally runs 100-200.  I explained to her that we need to make sure that there is no other causes such as it be related to her heart or to her brain.  She is had a history of a stroke that was affecting her walking previously and I am concerned that she could have had another small infarct although at this time she seems to be neuro intact.  I explained her wanted to get an EKG and considering an MRI but she states that she is only here for her sugar.  She was willing to let me call her son to further discuss. She reports being compliant with her HIV medications and that seems less likely be to be related to this given her last visit her CD4 was not significantly low  CBC slightly low  hemoglobin but pretty similar to priors without any evidence of white count elevation CMP shows slightly low sodium.  Glucose is 198 Initial troponin was negative and it seems unlikely to be related to ACS given this happened yesterday and patient was stuck multiple times for IV and unable to obtain therefore do not feel like we need to get a repeat troponin  MRI was negative.  There is some chronic mastoid and middle ear opacification.  On exam she does have some wax in that ear but not completely occluded we will give her ENT follow-up and we discussed using earwax drops over-the-counter.  Patient is now stating that she has had a lot of congestion and coughing up yellow mucus mostly in the morning for the past few weeks.  She is requesting something to help with this.  Patient could have some mild sinusitis.  Given its been going on for greater than 10 days we will try a course of antibiotics.  Patient's been amblatory around the room feels at her baseline self and is comfortable with discharge home at this time   The patient is on the cardiac monitor to evaluate for evidence of arrhythmia and/or significant heart rate changes.   FINAL CLINICAL IMPRESSION(S) / ED DIAGNOSES   Final diagnoses:  Acute non-recurrent sinusitis, unspecified location  Dizziness     Rx / DC Orders   ED Discharge Orders          Ordered    amoxicillin-clavulanate (AUGMENTIN) 875-125 MG tablet  2 times daily        01/09/22 1254             Note:  This document was prepared using Dragon voice recognition software and may include unintentional dictation errors.   Vanessa Baltic, MD 01/09/22 1256

## 2022-01-09 NOTE — ED Triage Notes (Addendum)
Pt comes with c/o blood sugar problem. Pt states she needs her sugar checked.  Pt states her sugars have been running low. Pt states it was 101 last night.   Pt states this am she did feel a little off balance when she got up. Pt denies any blurry vision, dizziness or headache at this time.  CBG-216

## 2022-01-09 NOTE — ED Notes (Signed)
Patient transported to MRI 

## 2022-01-09 NOTE — ED Notes (Addendum)
This RN at bedside. Pt arm still bleeding but pt will not hold pressure on, pt continuously moving the gauze. Explained to pt that the doctor ordered an EKG because of the dizziness she had reported. Pt states that she is here for her sugar and not here for her "head" and states "they are trying to make it about my head but it is not" explained with her previous hx they wanted to make sure everything was fine. Pt will not allow me to do her EKG because she wanted to wait until the doctor talks to her son and that she had an EKG done "a year ago." Pt will not let me do EKG at this time. Dr. Jari Pigg, MD made aware.

## 2022-01-12 ENCOUNTER — Ambulatory Visit: Payer: Self-pay

## 2022-01-12 NOTE — Telephone Encounter (Signed)
°  Chief Complaint: advice  Symptoms: blood flow fast during blood draw Frequency: the other day Pertinent Negatives: NA Disposition: [] ED /[] Urgent Care (no appt availability in office) / [] Appointment(In office/virtual)/ []  [facility]/ [x] Home Care/ [] Refused Recommended Disposition /[] [facility]/ []  Follow-up with PCP Additional Notes: Pt was concerned and wanted to know if bleeding that fast was ok. Advised her since she was on Plavix that is typical at times but just to monitor for signs of bleeding or bruising and to call PCP if she experienced any dizziness.   Summary: Question about blood draw   Pt on the line stated when she went to [facility] she had blood work done, and when the nurse tried to draw blood, her blood started coming out quickly and with a lot of blood.   Pt is concerned, asking if this is dangerous. She is concerned as she has never bled so much.    Pt denied any dizziness or pain denied feeling sick stated she is just walking slow but no pain.   Pt made mention of them giving her blood thinner.    Seeking clinical advice.       Reason for Disposition  Health Information question, no triage required and triager able to answer question  Answer Assessment - Initial Assessment Questions 1. REASON FOR CALL or QUESTION: "What is your reason for calling today?" or "How can I best help you?" or "What question do you have that I can help answer?"     Had blood draws done the other day and was concerned about how fast blood came out  Protocols used: Information Only Call - No Triage-A-AH

## 2022-02-09 ENCOUNTER — Other Ambulatory Visit: Payer: Self-pay | Admitting: Internal Medicine

## 2022-03-21 ENCOUNTER — Ambulatory Visit
Admission: RE | Admit: 2022-03-21 | Discharge: 2022-03-21 | Disposition: A | Discharge status: Home / Self Care | Payer: Medicare HMO | Source: Ambulatory Visit | Attending: Internal Medicine | Admitting: Internal Medicine

## 2022-03-21 DIAGNOSIS — Z1231 Encounter for screening mammogram for malignant neoplasm of breast: Secondary | ICD-10-CM | POA: Diagnosis not present

## 2022-05-04 ENCOUNTER — Other Ambulatory Visit
Admission: RE | Admit: 2022-05-04 | Discharge: 2022-05-04 | Disposition: A | Discharge status: Home / Self Care | Payer: Medicare HMO | Attending: Infectious Diseases | Admitting: Infectious Diseases

## 2022-05-04 ENCOUNTER — Encounter: Payer: Self-pay | Admitting: Infectious Diseases

## 2022-05-04 ENCOUNTER — Ambulatory Visit: Payer: Medicare HMO | Attending: Infectious Diseases | Admitting: Infectious Diseases

## 2022-05-04 VITALS — BP 147/70 | HR 68 | Temp 97.2°F | Ht 64.0 in | Wt 127.0 lb

## 2022-05-04 DIAGNOSIS — E785 Hyperlipidemia, unspecified: Secondary | ICD-10-CM | POA: Diagnosis not present

## 2022-05-04 DIAGNOSIS — Z7902 Long term (current) use of antithrombotics/antiplatelets: Secondary | ICD-10-CM | POA: Diagnosis not present

## 2022-05-04 DIAGNOSIS — E1140 Type 2 diabetes mellitus with diabetic neuropathy, unspecified: Secondary | ICD-10-CM | POA: Diagnosis not present

## 2022-05-04 DIAGNOSIS — I2510 Atherosclerotic heart disease of native coronary artery without angina pectoris: Secondary | ICD-10-CM | POA: Diagnosis not present

## 2022-05-04 DIAGNOSIS — Z8673 Personal history of transient ischemic attack (TIA), and cerebral infarction without residual deficits: Secondary | ICD-10-CM | POA: Diagnosis not present

## 2022-05-04 DIAGNOSIS — H9192 Unspecified hearing loss, left ear: Secondary | ICD-10-CM | POA: Diagnosis not present

## 2022-05-04 DIAGNOSIS — I10 Essential (primary) hypertension: Secondary | ICD-10-CM | POA: Insufficient documentation

## 2022-05-04 DIAGNOSIS — B20 Human immunodeficiency virus [HIV] disease: Secondary | ICD-10-CM | POA: Insufficient documentation

## 2022-05-04 LAB — COMPREHENSIVE METABOLIC PANEL
ALT: 18 U/L (ref 0–44)
AST: 23 U/L (ref 15–41)
Albumin: 3.9 g/dL (ref 3.5–5.0)
Alkaline Phosphatase: 51 U/L (ref 38–126)
Anion gap: 10 (ref 5–15)
BUN: 17 mg/dL (ref 8–23)
CO2: 27 mmol/L (ref 22–32)
Calcium: 9.9 mg/dL (ref 8.9–10.3)
Chloride: 104 mmol/L (ref 98–111)
Creatinine, Ser: 0.82 mg/dL (ref 0.44–1.00)
GFR, Estimated: >60 mL/min (ref >60)
Glucose, Bld: 109 mg/dL — ABNORMAL HIGH (ref 70–99)
Potassium: 3.6 mmol/L (ref 3.5–5.1)
Sodium: 141 mmol/L (ref 135–145)
Total Bilirubin: 0.8 mg/dL (ref 0.3–1.2)
Total Protein: 7.1 g/dL (ref 6.5–8.1)

## 2022-05-04 LAB — CBC WITH DIFFERENTIAL/PLATELET
Abs Immature Granulocytes: 0.02 10*3/uL (ref 0.00–0.07)
Basophils Absolute: 0 10*3/uL (ref 0.0–0.1)
Basophils Relative: 1 %
Eosinophils Absolute: 0.1 10*3/uL (ref 0.0–0.5)
Eosinophils Relative: 1 %
HCT: 33.1 % — ABNORMAL LOW (ref 36.0–46.0)
Hemoglobin: 11.1 g/dL — ABNORMAL LOW (ref 12.0–15.0)
Immature Granulocytes: 0 %
Lymphocytes Relative: 37 %
Lymphs Abs: 2.3 10*3/uL (ref 0.7–4.0)
MCH: 31 pg (ref 26.0–34.0)
MCHC: 33.5 g/dL (ref 30.0–36.0)
MCV: 92.5 fL (ref 80.0–100.0)
Monocytes Absolute: 0.4 10*3/uL (ref 0.1–1.0)
Monocytes Relative: 7 %
Neutro Abs: 3.5 10*3/uL (ref 1.7–7.7)
Neutrophils Relative %: 54 %
Platelets: 273 10*3/uL (ref 150–400)
RBC: 3.58 MIL/uL — ABNORMAL LOW (ref 3.87–5.11)
RDW: 14.7 % (ref 11.5–15.5)
WBC: 6.3 10*3/uL (ref 4.0–10.5)
nRBC: 0 % (ref 0.0–0.2)

## 2022-05-04 MED ORDER — EMTRICITABINE-TENOFOVIR AF 200-25 MG PO TABS: 1.0000 | ORAL_TABLET | Freq: Every day | ORAL | 6 refills | Status: DC

## 2022-05-04 MED ORDER — TIVICAY 50 MG PO TABS: 50.0000 mg | ORAL_TABLET | Freq: Every day | ORAL | 6 refills | Status: DC

## 2022-05-04 NOTE — Patient Instructions (Signed)
You are here for follow up of the virus- you are on tivicay and descovy Will do labs today Follow up 6 month
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
## 2022-05-05 LAB — T-HELPER CELLS CD4/CD8 %
% CD 4 Pos. Lymph.: 25.3 % — ABNORMAL LOW (ref 30.8–58.5)
Absolute CD 4 Helper: 557 /uL (ref 359–1519)
Basophils Absolute: 0 10*3/uL (ref 0.0–0.2)
Basos: 1 %
CD3+CD4+ Cells/CD3+CD8+ Cells Bld: 0.45 — ABNORMAL LOW (ref 0.92–3.72)
CD3+CD8+ Cells # Bld: 1247 /uL — ABNORMAL HIGH (ref 109–897)
CD3+CD8+ Cells NFr Bld: 56.7 % — ABNORMAL HIGH (ref 12.0–35.5)
EOS (ABSOLUTE): 0.1 10*3/uL (ref 0.0–0.4)
Eos: 1 %
Hematocrit: 32.5 % — ABNORMAL LOW (ref 34.0–46.6)
Hemoglobin: 11.5 g/dL (ref 11.1–15.9)
Immature Grans (Abs): 0 10*3/uL (ref 0.0–0.1)
Immature Granulocytes: 0 %
Lymphocytes Absolute: 2.2 10*3/uL (ref 0.7–3.1)
Lymphs: 35 %
MCH: 32 pg (ref 26.6–33.0)
MCHC: 35.4 g/dL (ref 31.5–35.7)
MCV: 91 fL (ref 79–97)
Monocytes Absolute: 0.5 10*3/uL (ref 0.1–0.9)
Monocytes: 7 %
Neutrophils Absolute: 3.4 10*3/uL (ref 1.4–7.0)
Neutrophils: 56 %
Platelets: 286 10*3/uL (ref 150–450)
RBC: 3.59 x10E6/uL — ABNORMAL LOW (ref 3.77–5.28)
RDW: 14.6 % (ref 11.7–15.4)
WBC: 6.1 10*3/uL (ref 3.4–10.8)

## 2022-05-05 LAB — RPR: RPR Ser Ql: NONREACTIVE

## 2022-05-05 LAB — HIV-1 RNA QUANT-NO REFLEX-BLD
HIV 1 RNA Quant: <20 copies/mL
LOG10 HIV-1 RNA: UNDETERMINED log10copy/mL

## 2022-05-10 ENCOUNTER — Telehealth: Payer: Self-pay

## 2022-05-10 NOTE — Telephone Encounter (Signed)
-----   Message from Tsosie Billing, MD sent at 05/10/2022  7:33 AM EDT ----- Regarding: FW: Can you let her know that her labs drawn from last week visit are okay. Viral load undetectable. T cell is lower than before at 554 but nothing to worry now. Can check sooner.Judeth Horn ----- Message ----- From: Buel Ream, [facility] Sent: 05/04/2022  10:49 AM EDT To: Tsosie Billing, MD

## 2022-05-10 NOTE — Telephone Encounter (Signed)
Called and spoke with patient and communicated message verbatim from Dr. Delaine Lame that her "labs drawn from last week visit are okay. Viral load undetectable. T cell is lower than before at 554 but nothing to worry now."  All questions answered. Patient verbalized understanding and stated she will continue to take her medication as prescribed.  Binnie Kand, RN

## 2022-05-26 LAB — MISC LABCORP TEST (SEND OUT): Labcorp test code: 551776

## 2022-06-26 ENCOUNTER — Ambulatory Visit (INDEPENDENT_AMBULATORY_CARE_PROVIDER_SITE_OTHER): Payer: Medicare HMO | Admitting: Vascular Surgery

## 2022-06-26 ENCOUNTER — Encounter (INDEPENDENT_AMBULATORY_CARE_PROVIDER_SITE_OTHER): Payer: Self-pay | Admitting: Vascular Surgery

## 2022-06-26 ENCOUNTER — Other Ambulatory Visit (INDEPENDENT_AMBULATORY_CARE_PROVIDER_SITE_OTHER): Payer: Self-pay | Admitting: Vascular Surgery

## 2022-06-26 ENCOUNTER — Ambulatory Visit (INDEPENDENT_AMBULATORY_CARE_PROVIDER_SITE_OTHER): Payer: Medicare HMO

## 2022-06-26 VITALS — BP 164/78 | HR 75 | Resp 16 | Wt 127.4 lb

## 2022-06-26 DIAGNOSIS — E782 Mixed hyperlipidemia: Secondary | ICD-10-CM

## 2022-06-26 DIAGNOSIS — I10 Essential (primary) hypertension: Secondary | ICD-10-CM

## 2022-06-26 DIAGNOSIS — I6501 Occlusion and stenosis of right vertebral artery: Secondary | ICD-10-CM | POA: Diagnosis not present

## 2022-06-26 DIAGNOSIS — I6523 Occlusion and stenosis of bilateral carotid arteries: Secondary | ICD-10-CM

## 2022-06-26 DIAGNOSIS — I739 Peripheral vascular disease, unspecified: Secondary | ICD-10-CM | POA: Diagnosis not present

## 2022-06-26 DIAGNOSIS — E1159 Type 2 diabetes mellitus with other circulatory complications: Secondary | ICD-10-CM

## 2022-07-05 ENCOUNTER — Encounter (INDEPENDENT_AMBULATORY_CARE_PROVIDER_SITE_OTHER): Payer: Self-pay | Admitting: Vascular Surgery

## 2022-07-05 ENCOUNTER — Other Ambulatory Visit (INDEPENDENT_AMBULATORY_CARE_PROVIDER_SITE_OTHER): Payer: Self-pay | Admitting: Vascular Surgery

## 2022-07-05 DIAGNOSIS — I6501 Occlusion and stenosis of right vertebral artery: Secondary | ICD-10-CM

## 2022-07-05 DIAGNOSIS — I6529 Occlusion and stenosis of unspecified carotid artery: Secondary | ICD-10-CM | POA: Insufficient documentation

## 2022-07-05 DIAGNOSIS — I739 Peripheral vascular disease, unspecified: Secondary | ICD-10-CM | POA: Insufficient documentation

## 2022-07-05 NOTE — Progress Notes (Signed)
MRN : [id number]  Janet Keller is a 81 y.o. (Sep 10, 1941) female who presents with chief complaint of check circulation.  History of Present Illness:   The patient is seen for follow up evaluation of vertebral artery occlusion and carotid stenosis.  The vertebral stenosis was identified after both CTA and MRA in 07/2021.  She was admitted to [facility] at that time for stroke symptoms.   The patient denies recent amaurosis fugax. There is no recent history of TIA symptoms or focal motor deficits. There is a prior documented CVA.   There is no history of migraine headaches. There is no history of seizures.   The patient is taking enteric-coated aspirin 81 mg daily with Plavix 75 mg daily and Lipitor.  The patient is c/o increased claudication symptoms.  She is noting mild rest pain symptoms.   The patient does not have a history of coronary artery disease, no recent episodes of angina or shortness of breath.  There is a history of hyperlipidemia which is being treated with a statin.     I have personally reviewed both the CT angiogram as well as the MRA and concur there is less than 30% stenosis of the internal carotid arteries bilaterally.  Left vertebral artery is widely patent as is the basilar right vertebral artery is occluded.  Carotid duplex shows 1-39% bilateral carotid artery stenosis.    Current Meds  Medication Sig   acetaminophen (TYLENOL) 500 MG tablet Take 500 mg by mouth every 6 (six) hours as needed.   acetic acid-hydrocortisone (VOSOL-HC) OTIC solution Place 4 drops in ear(s) 2 (two) times a week.   amLODipine (NORVASC) 5 MG tablet Take 10 mg by mouth daily.   aspirin EC 81 MG tablet Take 81 mg by mouth daily.   atorvastatin (LIPITOR) 40 MG tablet Take 1 tablet (40 mg total) by mouth daily.   Calcium Carbonate (CALCIUM-CARB 600 PO) Take 2 tablets by mouth daily.   clopidogrel (PLAVIX) 75 MG tablet Take 1 tablet (75 mg total) by mouth daily.    clotrimazole-betamethasone (LOTRISONE) cream Apply topically 2 (two) times daily.   dolutegravir (TIVICAY) 50 MG tablet Take 1 tablet (50 mg total) by mouth daily.   emtricitabine-tenofovir AF (DESCOVY) 200-25 MG tablet Take 1 tablet by mouth daily.   enalapril (VASOTEC) 10 MG tablet Take 2 tablets by mouth 2 (two) times daily.   estradiol (ESTRACE) 0.1 MG/GM vaginal cream Place 0.5 application vaginally once a week.   Fluocinolone Acetonide 0.01 % OIL Place 2 drops in ear(s) at bedtime.   fluticasone (FLONASE) 50 MCG/ACT nasal spray Place into the nose.   hydrocortisone 2.5 % cream Apply 1 application topically in the morning and at bedtime.   metFORMIN (GLUCOPHAGE) 500 MG tablet Take 1 tablet by mouth 2 (two) times daily with a meal.   metoprolol tartrate (LOPRESSOR) 25 MG tablet Take 25 mg by mouth 2 (two) times daily.    Multiple Vitamins-Minerals (MULTIVITAMIN ADULT PO) Take 1 tablet by mouth daily.   nystatin ointment (MYCOSTATIN) Apply 1 application topically 2 (two) times daily as needed (skin irritation).    ONETOUCH VERIO test strip 2 (two) times daily.   Polyethyl Glycol-Propyl Glycol (SYSTANE) 0.4-0.3 % SOLN Apply 1 drop to eye daily as needed.    Past Medical History:  Diagnosis Date   Arthritis    osteoarthritis   Breast pain    Diabetes mellitus without  complication (HCC)    GERD (gastroesophageal reflux disease)    HIV (human immunodeficiency virus infection) ([place])    Hyperlipidemia    Hypertension    MALT lymphoma ([place])     Past Surgical History:  Procedure Laterality Date   BREAST BIOPSY Left 1985   neg   CATARACT EXTRACTION W/ INTRAOCULAR LENS  IMPLANT, BILATERAL     HAMMER TOE SURGERY Bilateral 02/01/2018   Procedure: HAMMER TOE CORRECTION/Bil 5TH toes;  Surgeon: Sharlotte Alamo, DPM;  Location: [facility];  Service: Podiatry;  Laterality: Bilateral;   HYSTEROSCOPY WITH D & C N/A 02/18/2016   Procedure: Fractional DILATATION AND CURETTAGE /HYSTEROSCOPY , Polypectomy  and myomectomy;  Surgeon: Benjaman Kindler, MD;  Location: [facility];  Service: Gynecology;  Laterality: N/A;   TUBAL LIGATION      Social History Social History   Tobacco Use   Smoking status: Former   Smokeless tobacco: Never  Scientific laboratory technician Use: Never used  Substance Use Topics   Alcohol use: Yes    Alcohol/week: 1.0 standard drink of alcohol    Types: 1 Glasses of wine per week    Comment: previous ETOH abuse   Drug use: No    Family History Family History  Problem Relation Age of Onset   Breast cancer Mother 22    No Known Allergies   REVIEW OF SYSTEMS (Negative unless checked)  Constitutional: '[]'$ Weight loss  '[]'$ Fever  '[]'$ Chills Cardiac: '[]'$ Chest pain   '[]'$ Chest pressure   '[]'$ Palpitations   '[]'$ Shortness of breath when laying flat   '[]'$ Shortness of breath with exertion. Vascular:  '[x]'$ Pain in legs with walking   '[]'$ Pain in legs at rest  '[]'$ History of DVT   '[]'$ Phlebitis   '[]'$ Swelling in legs   '[]'$ Varicose veins   '[]'$ Non-healing ulcers Pulmonary:   '[]'$ Uses home oxygen   '[]'$ Productive cough   '[]'$ Hemoptysis   '[]'$ Wheeze  '[]'$ COPD   '[]'$ Asthma Neurologic:  '[]'$ Dizziness   '[]'$ Seizures   '[]'$ History of stroke   '[]'$ History of TIA  '[]'$ Aphasia   '[]'$ Vissual changes   '[]'$ Weakness or numbness in arm   '[]'$ Weakness or numbness in leg Musculoskeletal:   '[]'$ Joint swelling   '[]'$ Joint pain   '[]'$ Low back pain Hematologic:  '[]'$ Easy bruising  '[]'$ Easy bleeding   '[]'$ Hypercoagulable state   '[]'$ Anemic Gastrointestinal:  '[]'$ Diarrhea   '[]'$ Vomiting  '[]'$ Gastroesophageal reflux/heartburn   '[]'$ Difficulty swallowing. Genitourinary:  '[]'$ Chronic kidney disease   '[]'$ Difficult urination  '[]'$ Frequent urination   '[]'$ Blood in urine Skin:  '[]'$ Rashes   '[]'$ Ulcers  Psychological:  '[]'$ History of anxiety   '[]'$  History of major depression.  Physical Examination  Vitals:   06/26/22 1529  BP: (!) 164/78  Pulse: 75  Resp: 16  Weight: 127 lb 6.4 oz (57.8 kg)   Body mass index is 21.87 kg/m. Gen: WD/WN, NAD Head: [place]/AT, No temporalis wasting.   Ear/Nose/Throat: Hearing grossly intact, nares w/o erythema or drainage Eyes: PER, EOMI, sclera nonicteric.  Neck: Supple, no masses.  No bruit or JVD.  Pulmonary:  Good air movement, no audible wheezing, no use of accessory muscles.  Cardiac: RRR, normal S1, S2, no Murmurs. Vascular:  mild trophic changes, no open wounds Vessel Right Left  Radial Palpable Palpable  Carotid Palpable Palpable  PT Not Palpable Not Palpable  DP Not Palpable Not Palpable  Gastrointestinal: soft, non-distended. No guarding/no peritoneal signs.  Musculoskeletal: M/S 5/5 throughout.  No visible deformity.  Neurologic: CN 2-12 intact. Pain and light touch intact in extremities.  Symmetrical.  Speech is fluent. Motor exam  as listed above. Psychiatric: Judgment intact, Mood & affect appropriate for pt's clinical situation. Dermatologic: No rashes or ulcers noted.  No changes consistent with cellulitis.   CBC Lab Results  Component Value Date   WBC 6.3 05/04/2022   WBC 6.1 05/04/2022   HGB 11.1 (L) 05/04/2022   HGB 11.5 05/04/2022   HCT 33.1 (L) 05/04/2022   HCT 32.5 (L) 05/04/2022   MCV 92.5 05/04/2022   MCV 91 05/04/2022   PLT 273 05/04/2022   PLT 286 05/04/2022    BMET    Component Value Date/Time   NA 141 05/04/2022 1021   K 3.6 05/04/2022 1021   CL 104 05/04/2022 1021   CO2 27 05/04/2022 1021   GLUCOSE 109 (H) 05/04/2022 1021   BUN 17 05/04/2022 1021   CREATININE 0.82 05/04/2022 1021   CALCIUM 9.9 05/04/2022 1021   GFRNONAA >60 05/04/2022 1021   GFRAA >60 02/26/2020 1026   CrCl cannot be calculated (Patient's most recent lab result is older than the maximum 21 days allowed.).  COAG Lab Results  Component Value Date   INR 0.9 08/23/2021   INR 1.02 02/10/2016    Radiology VAS US CAROTID  Result Date: 06/26/2022 Carotid Arterial Duplex Study Patient Name:  ANGELLINA FERDINAND  Date of Exam:   06/26/2022 Medical Rec #: [id number]           Accession #:    [id number] Date of Birth:  1941-03-30            Patient Gender: F Patient Age:   64 years Exam Location:  [facility] Procedure:      VAS US CAROTID Referring Phys: Hortencia Pilar --------------------------------------------------------------------------------  Indications:       Carotid artery disease. Risk Factors:      Hypertension, hyperlipidemia, Diabetes, past history of                    smoking. Comparison Study:  CT angio 08/23/2021 showed minimal ICA stenosis and occluded                    right vertebral artery. Performing Technologist: Delorise Shiner RVT  Examination Guidelines: A complete evaluation includes B-mode imaging, spectral Doppler, color Doppler, and power Doppler as needed of all accessible portions of each vessel. Bilateral testing is considered an integral part of a complete examination. Limited examinations for reoccurring indications may be performed as noted.  Right Carotid Findings: +----------+--------+--------+--------+-----------------------+--------+           PSV cm/sEDV cm/sStenosisPlaque Description     Comments +----------+--------+--------+--------+-----------------------+--------+ CCA Prox  123     9                                               +----------+--------+--------+--------+-----------------------+--------+ CCA Mid   80      12                                              +----------+--------+--------+--------+-----------------------+--------+ CCA Distal60      14                                              +----------+--------+--------+--------+-----------------------+--------+  ICA Prox  59      17      1-39%   heterogenous and smooth         +----------+--------+--------+--------+-----------------------+--------+ ICA Mid   74      19                                              +----------+--------+--------+--------+-----------------------+--------+ ICA Distal59      15                                               +----------+--------+--------+--------+-----------------------+--------+ ECA       51      0                                               +----------+--------+--------+--------+-----------------------+--------+ +----------+--------+-------+----------------+-------------------+           PSV cm/sEDV cmsDescribe        Arm Pressure (mmHG) +----------+--------+-------+----------------+-------------------+ [id number]            Multiphasic, WNL                    +----------+--------+-------+----------------+-------------------+ +---------+--------+-+--------+------+ VertebralPSV cm/s0EDV cm/sAbsent +---------+--------+-+--------+------+  Left Carotid Findings: +----------+--------+--------+--------+--------------------------+--------+           PSV cm/sEDV cm/sStenosisPlaque Description        Comments +----------+--------+--------+--------+--------------------------+--------+ CCA Prox  84      12                                                 +----------+--------+--------+--------+--------------------------+--------+ CCA Mid   84      16                                                 +----------+--------+--------+--------+--------------------------+--------+ CCA Distal74      14                                                 +----------+--------+--------+--------+--------------------------+--------+ ICA Prox  76      15      1-39%   heterogenous and irregular         +----------+--------+--------+--------+--------------------------+--------+ ICA Mid   68      12                                                 +----------+--------+--------+--------+--------------------------+--------+ ICA Distal77      21                                                 +----------+--------+--------+--------+--------------------------+--------+  ECA       63      12                                                  +----------+--------+--------+--------+--------------------------+--------+ +----------+--------+--------+----------------+-------------------+           PSV cm/sEDV cm/sDescribe        Arm Pressure (mmHG) +----------+--------+--------+----------------+-------------------+ [id number]     0       Multiphasic, WNL                    +----------+--------+--------+----------------+-------------------+ +---------+--------+--+--------+-+---------+ VertebralPSV cm/s53EDV cm/s9Antegrade +---------+--------+--+--------+-+---------+   Summary: Right Carotid: Velocities in the right ICA are consistent with a 1-39% stenosis. Left Carotid: Velocities in the left ICA are consistent with a 1-39% stenosis. Vertebrals:  Left vertebral artery demonstrates antegrade flow. Right vertebral              artery demonstrates an occlusion. Subclavians: Normal flow hemodynamics were seen in bilateral subclavian              arteries. *See table(s) above for measurements and observations.  Electronically signed by Hortencia Pilar MD on 06/26/2022 at 5:13:21 PM.    Final      Assessment/Plan 1. Bilateral carotid artery stenosis Recommend:  Given the patient's asymptomatic subcritical stenosis no further invasive testing or surgery at this time.  Duplex ultrasound shows 1-39% stenosis bilaterally.  Continue antiplatelet therapy as prescribed Continue management of CAD, HTN and Hyperlipidemia Healthy heart diet,  encouraged exercise at least 4 times per week Follow up in 12 months with duplex ultrasound and physical exam    2. Occlusion and stenosis of right vertebral artery See #1  3. PAD (peripheral artery disease) (HCC) Recommend:  Patient should undergo arterial duplex of the lower extremity because there has been a significant deterioration in the patient's lower extremity symptoms.  The patient states they are having increased pain and a marked decrease in the distance that they can walk.  The  risks and benefits as well as the alternatives were discussed in detail with the patient.  All questions were answered.  Patient agrees to proceed and understands this could be a prelude to angiography and intervention.  The patient will follow up with me in the office to review the studies.    4. Benign essential hypertension Continue antihypertensive medications as already ordered, these medications have been reviewed and there are no changes at this time.   5. Type 2 diabetes mellitus with other circulatory complication, unspecified whether long term insulin use (HCC) Continue hypoglycemic medications as already ordered, these medications have been reviewed and there are no changes at this time.  Hgb A1C to be monitored as already arranged by primary service   6. Mixed hyperlipidemia Continue statin as ordered and reviewed, no changes at this time     Hortencia Pilar, MD  07/05/2022 2:22 PM

## 2022-07-06 ENCOUNTER — Encounter (INDEPENDENT_AMBULATORY_CARE_PROVIDER_SITE_OTHER): Payer: Self-pay | Admitting: Nurse Practitioner

## 2022-07-06 ENCOUNTER — Ambulatory Visit (INDEPENDENT_AMBULATORY_CARE_PROVIDER_SITE_OTHER): Payer: Medicare HMO | Admitting: Nurse Practitioner

## 2022-07-06 ENCOUNTER — Ambulatory Visit (INDEPENDENT_AMBULATORY_CARE_PROVIDER_SITE_OTHER): Payer: Medicare HMO

## 2022-07-06 VITALS — BP 164/75 | HR 56 | Resp 18 | Ht 64.0 in | Wt 125.0 lb

## 2022-07-06 DIAGNOSIS — I10 Essential (primary) hypertension: Secondary | ICD-10-CM | POA: Diagnosis not present

## 2022-07-06 DIAGNOSIS — I739 Peripheral vascular disease, unspecified: Secondary | ICD-10-CM | POA: Diagnosis not present

## 2022-07-06 DIAGNOSIS — I6501 Occlusion and stenosis of right vertebral artery: Secondary | ICD-10-CM

## 2022-07-06 DIAGNOSIS — E1159 Type 2 diabetes mellitus with other circulatory complications: Secondary | ICD-10-CM

## 2022-07-08 ENCOUNTER — Encounter (INDEPENDENT_AMBULATORY_CARE_PROVIDER_SITE_OTHER): Payer: Self-pay | Admitting: Nurse Practitioner

## 2022-07-08 NOTE — Progress Notes (Signed)
Subjective:    Patient ID: Janet Keller, female    DOB: January 01, 1941, 81 y.o.   MRN: [id number] No chief complaint on file.   The patient returns today for noninvasive studies.  The patient has a history of carotid disease and has some mild symptoms of claudication.  Given that we had the patient return today for noninvasive studies.  There was also concern of pain and discomfort in her right upper extremity with a palpable knot.  She currently denies any rest pain.  She denies any open wounds or ulcerations.  Today noninvasive studies show an ABI of 1.09 on the right and 0.89 on the left.  There is a TBI of 0.72 on the right and 0.55 on the left.  The patient has monophasic/biphasic waveforms on the right with monophasic on the left.  The right upper extremity has biphasic/triphasic waveforms throughout.  There is no evidence of pseudoaneurysm, hematoma or fistula at the painful site in the antecubital fossa.  In fact.  She notes that most of the pain from this is actually dissipated since she was last seen.    Review of Systems  Neurological:  Positive for numbness.  All other systems reviewed and are negative.      Objective:   Physical Exam Vitals reviewed.  HENT:     Head: Normocephalic.  Cardiovascular:     Rate and Rhythm: Normal rate and regular rhythm.     Pulses:          Radial pulses are 1+ on the right side and 1+ on the left side.       Dorsalis pedis pulses are detected w/ Doppler on the right side and detected w/ Doppler on the left side.       Posterior tibial pulses are detected w/ Doppler on the right side and detected w/ Doppler on the left side.  Pulmonary:     Effort: Pulmonary effort is normal.  Skin:    General: Skin is warm and dry.  Neurological:     Mental Status: She is alert and oriented to person, place, and time.  Psychiatric:        Mood and Affect: Mood normal.        Behavior: Behavior normal.        Thought Content: Thought content normal.         Judgment: Judgment normal.     BP (!) 164/75 (BP Location: Right Arm)   Pulse (!) 56   Resp 18   Ht '5\' 4"'$  (1.626 m)   Wt 125 lb (56.7 kg)   BMI 21.46 kg/m   Past Medical History:  Diagnosis Date   Arthritis    osteoarthritis   Breast pain    Diabetes mellitus without complication (HCC)    GERD (gastroesophageal reflux disease)    HIV (human immunodeficiency virus infection) ([place])    Hyperlipidemia    Hypertension    MALT lymphoma (HCC)     Social History   Socioeconomic History   Marital status: Single    Spouse name: Not on file   Number of children: Not on file   Years of education: Not on file   Highest education level: Not on file  Occupational History   Not on file  Tobacco Use   Smoking status: Former   Smokeless tobacco: Never  Vaping Use   Vaping Use: Never used  Substance and Sexual Activity   Alcohol use: Yes    Alcohol/week: 1.0 standard drink  of alcohol    Types: 1 Glasses of wine per week    Comment: previous ETOH abuse   Drug use: No   Sexual activity: Not on file  Other Topics Concern   Not on file  Social History Narrative   Not on file   Social Determinants of Health   Financial Resource Strain: Not on file  Food Insecurity: Not on file  Transportation Needs: Not on file  Physical Activity: Not on file  Stress: Not on file  Social Connections: Not on file  Intimate Partner Violence: Not on file    Past Surgical History:  Procedure Laterality Date   BREAST BIOPSY Left 1985   neg   CATARACT EXTRACTION W/ INTRAOCULAR LENS  IMPLANT, BILATERAL     HAMMER TOE SURGERY Bilateral 02/01/2018   Procedure: HAMMER TOE CORRECTION/Bil 5TH toes;  Surgeon: Sharlotte Alamo, DPM;  Location: [facility] ORS;  Service: Podiatry;  Laterality: Bilateral;   HYSTEROSCOPY WITH D & C N/A 02/18/2016   Procedure: Fractional DILATATION AND CURETTAGE /HYSTEROSCOPY , Polypectomy and myomectomy;  Surgeon: Benjaman Kindler, MD;  Location: [facility] ORS;  Service: Gynecology;   Laterality: N/A;   TUBAL LIGATION      Family History  Problem Relation Age of Onset   Breast cancer Mother 8    No Known Allergies     Latest Ref Rng & Units 05/04/2022   10:21 AM 01/09/2022    9:44 AM 08/25/2021    4:15 AM  CBC  WBC 4.0 - 10.5 K/uL 3.4 - 10.8 x10E3/uL 6.3    6.1  5.9  7.4   Hemoglobin 12.0 - 15.0 g/dL 11.1 - 15.9 g/dL 11.1    11.5  11.7  12.0   Hematocrit 36.0 - 46.0 % 34.0 - 46.6 % 33.1    32.5  35.1  35.2   Platelets 150 - 400 K/uL 150 - 450 x10E3/uL 273    286  300  299       CMP     Component Value Date/Time   NA 141 05/04/2022 1021   K 3.6 05/04/2022 1021   CL 104 05/04/2022 1021   CO2 27 05/04/2022 1021   GLUCOSE 109 (H) 05/04/2022 1021   BUN 17 05/04/2022 1021   CREATININE 0.82 05/04/2022 1021   CALCIUM 9.9 05/04/2022 1021   PROT 7.1 05/04/2022 1021   ALBUMIN 3.9 05/04/2022 1021   AST 23 05/04/2022 1021   ALT 18 05/04/2022 1021   ALKPHOS 51 05/04/2022 1021   BILITOT 0.8 05/04/2022 1021   GFRNONAA >60 05/04/2022 1021   GFRAA >60 02/26/2020 1026     VAS [place] ABI WITH/WO TBI  Result Date: 07/06/2022  LOWER EXTREMITY DOPPLER STUDY Patient Name:  Janet Keller  Date of Exam:   07/06/2022 Medical Rec #: [id number]           Accession #:    [id number] Date of Birth: 1941-03-24            Patient Gender: F Patient Age:   75 years Exam Location:  [facility] Procedure:      VAS [place] ABI WITH/WO TBI Referring Phys: GREGORY SCHNIER --------------------------------------------------------------------------------  Indications: Claudication, and peripheral artery disease. High Risk Factors: Hypertension, Diabetes, past history of smoking.  Performing Technologist: Delorise Shiner RVT  Examination Guidelines: A complete evaluation includes at minimum, Doppler waveform signals and systolic blood pressure reading at the level of bilateral brachial, anterior tibial, and posterior tibial arteries, when vessel segments are accessible. Bilateral  testing is considered an integral part of a complete examination. Photoelectric Plethysmograph (PPG) waveforms and toe systolic pressure readings are included as required and additional duplex testing as needed. Limited examinations for reoccurring indications may be performed as noted.  ABI Findings: +---------+------------------+-----+----------+--------+ Right    Rt Pressure (mmHg)IndexWaveform  Comment  +---------+------------------+-----+----------+--------+ Brachial 149                                       +---------+------------------+-----+----------+--------+ ATA      162               1.09 monophasic         +---------+------------------+-----+----------+--------+ PTA      163               1.09 biphasic           +---------+------------------+-----+----------+--------+ Great Toe108               0.72                    +---------+------------------+-----+----------+--------+ +---------+------------------+-----+-------------------+-------+ Left     Lt Pressure (mmHg)IndexWaveform           Comment +---------+------------------+-----+-------------------+-------+ Brachial 143                                               +---------+------------------+-----+-------------------+-------+ ATA      133               0.89 monophasic                 +---------+------------------+-----+-------------------+-------+ PTA      76                0.51 dampened monophasic        +---------+------------------+-----+-------------------+-------+ Great Toe82                0.55                            +---------+------------------+-----+-------------------+-------+ +-------+-----------+-----------+------------+------------+ ABI/TBIToday's ABIToday's TBIPrevious ABIPrevious TBI +-------+-----------+-----------+------------+------------+ Right  1.09       0.72                                +-------+-----------+-----------+------------+------------+  Left   0.89       0.55                                +-------+-----------+-----------+------------+------------+  Summary: Right: Resting right ankle-brachial index is within normal range. No evidence of significant right lower extremity arterial disease. The right toe-brachial index is normal. Left: Resting left ankle-brachial index indicates mild left lower extremity arterial disease. The left toe-brachial index is abnormal. *See table(s) above for measurements and observations.  Electronically signed by Hortencia Pilar MD on 07/06/2022 at 4:22:28 PM.    Final        Assessment & Plan:   1. PAD (peripheral artery disease) (HCC)  Recommend:  The patient has evidence of atherosclerosis of the lower extremities with claudication.  The patient does not voice lifestyle limiting changes at this point in time.  Noninvasive studies do not suggest  clinically significant change.  No invasive studies, angiography or surgery at this time The patient should continue walking and begin a more formal exercise program.  The patient should continue antiplatelet therapy and aggressive treatment of the lipid abnormalities  No changes in the patient's medications at this time  Continued surveillance is indicated as atherosclerosis is likely to progress with time.    The patient will continue follow up with noninvasive studies as ordered.    2. Type 2 diabetes mellitus with other circulatory complication, unspecified whether long term insulin use (HCC) Continue hypoglycemic medications as already ordered, these medications have been reviewed and there are no changes at this time.  Hgb A1C to be monitored as already arranged by primary service   3. Benign essential hypertension Continue antihypertensive medications as already ordered, these medications have been reviewed and there are no changes at this time.    Current Outpatient Medications on File Prior to Visit  Medication Sig Dispense Refill    acetaminophen (TYLENOL) 500 MG tablet Take 500 mg by mouth every 6 (six) hours as needed.     acetic acid-hydrocortisone (VOSOL-HC) OTIC solution Place 4 drops in ear(s) 2 (two) times a week.     amLODipine (NORVASC) 5 MG tablet Take 10 mg by mouth daily.     aspirin EC 81 MG tablet Take 81 mg by mouth daily.     atorvastatin (LIPITOR) 40 MG tablet Take 1 tablet (40 mg total) by mouth daily. 30 tablet 11   Calcium Carbonate (CALCIUM-CARB 600 PO) Take 2 tablets by mouth daily.     clopidogrel (PLAVIX) 75 MG tablet Take 1 tablet (75 mg total) by mouth daily. 21 tablet 0   clotrimazole-betamethasone (LOTRISONE) cream Apply topically 2 (two) times daily.     dolutegravir (TIVICAY) 50 MG tablet Take 1 tablet (50 mg total) by mouth daily. 30 tablet 6   emtricitabine-tenofovir AF (DESCOVY) 200-25 MG tablet Take 1 tablet by mouth daily. 30 tablet 6   enalapril (VASOTEC) 10 MG tablet Take 2 tablets by mouth 2 (two) times daily.     estradiol (ESTRACE) 0.1 MG/GM vaginal cream Place 0.5 application vaginally once a week.     Fluocinolone Acetonide 0.01 % OIL Place 2 drops in ear(s) at bedtime.     fluticasone (FLONASE) 50 MCG/ACT nasal spray Place into the nose.     GAVILAX 17 GM/SCOOP powder SMARTSIG:17 Gram(s) By Mouth Daily PRN     hydrocortisone 2.5 % cream Apply 1 application topically in the morning and at bedtime.     metFORMIN (GLUCOPHAGE) 500 MG tablet Take 1 tablet by mouth 2 (two) times daily with a meal.     metoprolol tartrate (LOPRESSOR) 25 MG tablet Take 25 mg by mouth 2 (two) times daily.      Multiple Vitamins-Minerals (MULTIVITAMIN ADULT PO) Take 1 tablet by mouth daily.     nystatin ointment (MYCOSTATIN) Apply 1 application topically 2 (two) times daily as needed (skin irritation).      ONETOUCH VERIO test strip 2 (two) times daily.     Polyethyl Glycol-Propyl Glycol (SYSTANE) 0.4-0.3 % SOLN Apply 1 drop to eye daily as needed.     fluticasone (FLONASE) 50 MCG/ACT nasal spray Place 1  spray into both nostrils daily.     No current facility-administered medications on file prior to visit.    There are no Patient Instructions on file for this visit. No follow-ups on file.   Kris Hartmann, NP

## 2022-08-01 ENCOUNTER — Telehealth: Payer: Self-pay

## 2022-08-01 NOTE — Telephone Encounter (Signed)
Patient called wanting to discuss lab results from last appointment. Relayed that she was undetectable and discussed what this means. Relayed normal CD4 count.   She says she has spoken with her PCP about her weight loss and they have made a decision to decrease her metformin from BID to just at night with dinner and she would like Dr. Delaine Lame to know.   Beryle Flock, RN

## 2022-09-25 ENCOUNTER — Encounter (INDEPENDENT_AMBULATORY_CARE_PROVIDER_SITE_OTHER): Payer: Self-pay

## 2022-10-26 ENCOUNTER — Encounter: Payer: Self-pay | Admitting: Internal Medicine

## 2022-10-26 DIAGNOSIS — Z1231 Encounter for screening mammogram for malignant neoplasm of breast: Secondary | ICD-10-CM

## 2022-11-07 ENCOUNTER — Other Ambulatory Visit
Admission: RE | Admit: 2022-11-07 | Discharge: 2022-11-07 | Disposition: A | Discharge status: Home / Self Care | Payer: Medicare HMO | Source: Ambulatory Visit | Attending: Infectious Diseases | Admitting: Infectious Diseases

## 2022-11-07 ENCOUNTER — Encounter: Payer: Self-pay | Admitting: Infectious Diseases

## 2022-11-07 ENCOUNTER — Ambulatory Visit: Payer: Medicare HMO | Attending: Infectious Diseases | Admitting: Infectious Diseases

## 2022-11-07 VITALS — BP 152/79 | HR 54 | Temp 97.2°F | Ht 64.0 in | Wt 135.0 lb

## 2022-11-07 DIAGNOSIS — B20 Human immunodeficiency virus [HIV] disease: Secondary | ICD-10-CM | POA: Diagnosis not present

## 2022-11-07 DIAGNOSIS — Z79899 Other long term (current) drug therapy: Secondary | ICD-10-CM | POA: Insufficient documentation

## 2022-11-07 DIAGNOSIS — I159 Secondary hypertension, unspecified: Secondary | ICD-10-CM

## 2022-11-07 DIAGNOSIS — I639 Cerebral infarction, unspecified: Secondary | ICD-10-CM | POA: Diagnosis not present

## 2022-11-07 DIAGNOSIS — Z79624 Long term (current) use of inhibitors of nucleotide synthesis: Secondary | ICD-10-CM | POA: Diagnosis not present

## 2022-11-07 DIAGNOSIS — Z87891 Personal history of nicotine dependence: Secondary | ICD-10-CM

## 2022-11-07 DIAGNOSIS — E785 Hyperlipidemia, unspecified: Secondary | ICD-10-CM | POA: Insufficient documentation

## 2022-11-07 DIAGNOSIS — I2510 Atherosclerotic heart disease of native coronary artery without angina pectoris: Secondary | ICD-10-CM | POA: Diagnosis not present

## 2022-11-07 DIAGNOSIS — H9192 Unspecified hearing loss, left ear: Secondary | ICD-10-CM | POA: Diagnosis not present

## 2022-11-07 DIAGNOSIS — E1140 Type 2 diabetes mellitus with diabetic neuropathy, unspecified: Secondary | ICD-10-CM | POA: Insufficient documentation

## 2022-11-07 DIAGNOSIS — I10 Essential (primary) hypertension: Secondary | ICD-10-CM | POA: Insufficient documentation

## 2022-11-07 DIAGNOSIS — Z8673 Personal history of transient ischemic attack (TIA), and cerebral infarction without residual deficits: Secondary | ICD-10-CM | POA: Diagnosis not present

## 2022-11-07 LAB — COMPREHENSIVE METABOLIC PANEL
ALT: 19 U/L (ref 0–44)
AST: 24 U/L (ref 15–41)
Albumin: 4.2 g/dL (ref 3.5–5.0)
Alkaline Phosphatase: 53 U/L (ref 38–126)
Anion gap: 9 (ref 5–15)
BUN: 13 mg/dL (ref 8–23)
CO2: 23 mmol/L (ref 22–32)
Calcium: 9.9 mg/dL (ref 8.9–10.3)
Chloride: 105 mmol/L (ref 98–111)
Creatinine, Ser: 0.88 mg/dL (ref 0.44–1.00)
GFR, Estimated: >60 mL/min (ref >60)
Glucose, Bld: 142 mg/dL — ABNORMAL HIGH (ref 70–99)
Potassium: 3.8 mmol/L (ref 3.5–5.1)
Sodium: 137 mmol/L (ref 135–145)
Total Bilirubin: 1.1 mg/dL (ref 0.3–1.2)
Total Protein: 7.9 g/dL (ref 6.5–8.1)

## 2022-11-07 LAB — CBC WITH DIFFERENTIAL/PLATELET
Abs Immature Granulocytes: 0.02 10*3/uL (ref 0.00–0.07)
Basophils Absolute: 0.1 10*3/uL (ref 0.0–0.1)
Basophils Relative: 1 %
Eosinophils Absolute: 0 10*3/uL (ref 0.0–0.5)
Eosinophils Relative: 1 %
HCT: 35.8 % — ABNORMAL LOW (ref 36.0–46.0)
Hemoglobin: 11.9 g/dL — ABNORMAL LOW (ref 12.0–15.0)
Immature Granulocytes: 0 %
Lymphocytes Relative: 40 %
Lymphs Abs: 2.7 10*3/uL (ref 0.7–4.0)
MCH: 31.1 pg (ref 26.0–34.0)
MCHC: 33.2 g/dL (ref 30.0–36.0)
MCV: 93.5 fL (ref 80.0–100.0)
Monocytes Absolute: 0.3 10*3/uL (ref 0.1–1.0)
Monocytes Relative: 5 %
Neutro Abs: 3.5 10*3/uL (ref 1.7–7.7)
Neutrophils Relative %: 53 %
Platelets: 284 10*3/uL (ref 150–400)
RBC: 3.83 MIL/uL — ABNORMAL LOW (ref 3.87–5.11)
RDW: 13.5 % (ref 11.5–15.5)
WBC: 6.6 10*3/uL (ref 4.0–10.5)
nRBC: 0 % (ref 0.0–0.2)

## 2022-11-07 LAB — LIPID PANEL
Cholesterol: 138 mg/dL (ref 0–200)
HDL: 48 mg/dL (ref >40)
LDL Cholesterol: 71 mg/dL (ref 0–99)
Total CHOL/HDL Ratio: 2.9 RATIO
Triglycerides: 94 mg/dL (ref <150)
VLDL: 19 mg/dL (ref 0–40)

## 2022-11-07 NOTE — Patient Instructions (Signed)
You are here for followup-for HIV- currently yo are on Descovy ( TAF+FTC) and Tivicay- from next prescription you will get Dovato ( which is Tivicay+ 3TC) . The change will help to avoid TAF and that will help with kidney function and bone . Esepcially that you take ibuprofen  sometimes will avoid Descovy Follow up 6 months
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
## 2022-11-08 ENCOUNTER — Other Ambulatory Visit: Payer: Self-pay | Admitting: Infectious Diseases

## 2022-11-08 LAB — T-HELPER CELLS CD4/CD8 %
% CD 4 Pos. Lymph.: 25.8 % — ABNORMAL LOW (ref 30.8–58.5)
Absolute CD 4 Helper: 722 /uL (ref 359–1519)
Basophils Absolute: 0 10*3/uL (ref 0.0–0.2)
Basos: 1 %
CD3+CD4+ Cells/CD3+CD8+ Cells Bld: 0.48 — ABNORMAL LOW (ref 0.92–3.72)
CD3+CD8+ Cells # Bld: 1490 /uL — ABNORMAL HIGH (ref 109–897)
CD3+CD8+ Cells NFr Bld: 53.2 % — ABNORMAL HIGH (ref 12.0–35.5)
EOS (ABSOLUTE): 0 10*3/uL (ref 0.0–0.4)
Eos: 1 %
Hematocrit: 35 % (ref 34.0–46.6)
Hemoglobin: 12.1 g/dL (ref 11.1–15.9)
Immature Grans (Abs): 0 10*3/uL (ref 0.0–0.1)
Immature Granulocytes: 0 %
Lymphocytes Absolute: 2.8 10*3/uL (ref 0.7–3.1)
Lymphs: 41 %
MCH: 31.7 pg (ref 26.6–33.0)
MCHC: 34.6 g/dL (ref 31.5–35.7)
MCV: 92 fL (ref 79–97)
Monocytes Absolute: 0.4 10*3/uL (ref 0.1–0.9)
Monocytes: 5 %
Neutrophils Absolute: 3.6 10*3/uL (ref 1.4–7.0)
Neutrophils: 52 %
Platelets: 286 10*3/uL (ref 150–450)
RBC: 3.82 x10E6/uL (ref 3.77–5.28)
RDW: 13.6 % (ref 11.7–15.4)
WBC: 6.8 10*3/uL (ref 3.4–10.8)

## 2022-11-08 LAB — HIV-1 RNA QUANT-NO REFLEX-BLD
HIV 1 RNA Quant: <20 copies/mL
LOG10 HIV-1 RNA: UNDETERMINED log10copy/mL

## 2022-11-08 LAB — RPR: RPR Ser Ql: NONREACTIVE

## 2022-11-08 MED ORDER — DOVATO 50-300 MG PO TABS: 1.0000 | ORAL_TABLET | Freq: Every day | ORAL | 5 refills | Status: DC

## 2022-11-09 ENCOUNTER — Other Ambulatory Visit: Payer: Self-pay | Admitting: Internal Medicine

## 2022-11-09 ENCOUNTER — Telehealth: Payer: Self-pay

## 2022-11-09 DIAGNOSIS — N644 Mastodynia: Secondary | ICD-10-CM

## 2022-11-09 NOTE — Telephone Encounter (Signed)
I attempted to contact the patient to relay lab results. Patient did not answer and I left a VM for her to call back. Jacarius Handel T Brooks Sailors

## 2022-11-09 NOTE — Telephone Encounter (Signed)
Patient informed of all lab results and verbalized understanding. Janet Keller T Brooks Sailors

## 2022-11-09 NOTE — Telephone Encounter (Signed)
-----   Message from Tsosie Billing, MD sent at 11/09/2022 12:27 PM EST ----- Please let her know that Vl < 20 and Cd4 is also high at 722- she is doing well. ----- Message ----- From: Buel Ream, Lab In [place] Sent: 11/07/2022  11:08 AM EST To: Tsosie Billing, MD

## 2022-11-11 LAB — QUANTIFERON-TB GOLD PLUS (RQFGPL)
QuantiFERON Mitogen Value: >10 IU/mL
QuantiFERON Nil Value: 0.27 IU/mL
QuantiFERON TB1 Ag Value: 0.27 IU/mL
QuantiFERON TB2 Ag Value: 0.25 IU/mL

## 2022-11-11 LAB — QUANTIFERON-TB GOLD PLUS: QuantiFERON-TB Gold Plus: NEGATIVE

## 2022-11-28 ENCOUNTER — Ambulatory Visit
Admission: RE | Admit: 2022-11-28 | Discharge: 2022-11-28 | Disposition: A | Discharge status: Home / Self Care | Payer: Medicare HMO | Source: Ambulatory Visit | Attending: Internal Medicine | Admitting: Internal Medicine

## 2022-11-28 DIAGNOSIS — N644 Mastodynia: Secondary | ICD-10-CM | POA: Diagnosis present

## 2022-11-30 ENCOUNTER — Other Ambulatory Visit: Payer: Self-pay

## 2022-11-30 MED ORDER — DOVATO 50-300 MG PO TABS: 1.0000 | ORAL_TABLET | Freq: Every day | ORAL | 5 refills | Status: DC

## 2023-01-10 ENCOUNTER — Other Ambulatory Visit (INDEPENDENT_AMBULATORY_CARE_PROVIDER_SITE_OTHER): Payer: Self-pay | Admitting: Nurse Practitioner

## 2023-01-10 DIAGNOSIS — I739 Peripheral vascular disease, unspecified: Secondary | ICD-10-CM

## 2023-01-10 DIAGNOSIS — I6523 Occlusion and stenosis of bilateral carotid arteries: Secondary | ICD-10-CM

## 2023-01-10 NOTE — Progress Notes (Signed)
MRN : [id number]  Janet Keller is a 82 y.o. (05/06/1941) female who presents with chief complaint of check carotid arteries.  History of Present Illness:  The patient is seen for follow up evaluation of vertebral artery occlusion and carotid stenosis.  The vertebral stenosis was identified after both CTA and MRA in 07/2021.  She was admitted to [facility] at that time for stroke symptoms.   The patient denies recent amaurosis fugax. There is no recent history of TIA symptoms or focal motor deficits. There is a prior documented CVA.   There is no history of migraine headaches. There is no history of seizures.   The patient is taking enteric-coated aspirin 81 mg daily with Plavix 75 mg daily and Lipitor.   The patient is c/o increased claudication symptoms.  She is noting mild rest pain symptoms.   The patient does not have a history of coronary artery disease, no recent episodes of angina or shortness of breath.   There is a history of hyperlipidemia which is being treated with a statin.     Previous CT angiogram as well as the MRA shows less than 30% stenosis of the internal carotid arteries bilaterally.  Left vertebral artery is widely patent as is the basilar right vertebral artery is occluded.   Carotid duplex shows 1-39% bilateral carotid artery stenosis.  Today noninvasive studies show an ABI of 1.03 on the right and 1.07 on the left. There is a TBI of 0.58 on the right and 0.75 on the left. The patient has monophasic/biphasic waveforms on the right with monophasic on the left.  Previous ABI ABI of 1.09 on the right and 0.89 on the left. There is a TBI of 0.72 on the right and 0.55 on the left.  No outpatient medications have been marked as taking for the 01/11/23 encounter (Appointment) with Delana Meyer, Dolores Lory, MD.    Past Medical History:  Diagnosis Date   Arthritis    osteoarthritis   Breast pain    Diabetes mellitus without complication ([place])    GERD (gastroesophageal  reflux disease)    HIV (human immunodeficiency virus infection) ([place])    Hyperlipidemia    Hypertension    MALT lymphoma ([place])     Past Surgical History:  Procedure Laterality Date   BREAST BIOPSY Left 1985   neg   CATARACT EXTRACTION W/ INTRAOCULAR LENS  IMPLANT, BILATERAL     HAMMER TOE SURGERY Bilateral 02/01/2018   Procedure: HAMMER TOE CORRECTION/Bil 5TH toes;  Surgeon: Sharlotte Alamo, DPM;  Location: [facility] ORS;  Service: Podiatry;  Laterality: Bilateral;   HYSTEROSCOPY WITH D & C N/A 02/18/2016   Procedure: Fractional DILATATION AND CURETTAGE /HYSTEROSCOPY , Polypectomy and myomectomy;  Surgeon: Benjaman Kindler, MD;  Location: [facility] ORS;  Service: Gynecology;  Laterality: N/A;   TUBAL LIGATION      Social History Social History   Tobacco Use   Smoking status: Former   Smokeless tobacco: Never  Scientific laboratory technician Use: Never used  Substance Use Topics   Alcohol use: Yes    Alcohol/week: 1.0 standard drink of alcohol    Types: 1 Glasses of wine per week    Comment: previous ETOH abuse   Drug use: No    Family History Family History  Problem Relation Age of Onset   Breast cancer Mother 30    No Known Allergies   REVIEW OF SYSTEMS (Negative unless checked)  Constitutional: []$ Weight loss  []$ Fever  []$ Chills Cardiac: []$   Chest pain   []$ Chest pressure   []$ Palpitations   []$ Shortness of breath when laying flat   []$ Shortness of breath with exertion. Vascular:  [x]$ Pain in legs with walking   []$ Pain in legs at rest  []$ History of DVT   []$ Phlebitis   []$ Swelling in legs   []$ Varicose veins   []$ Non-healing ulcers Pulmonary:   []$ Uses home oxygen   []$ Productive cough   []$ Hemoptysis   []$ Wheeze  []$ COPD   []$ Asthma Neurologic:  []$ Dizziness   []$ Seizures   []$ History of stroke   []$ History of TIA  []$ Aphasia   []$ Vissual changes   []$ Weakness or numbness in arm   []$ Weakness or numbness in leg Musculoskeletal:   []$ Joint swelling   []$ Joint pain   []$ Low back pain Hematologic:  []$ Easy bruising  []$ Easy  bleeding   []$ Hypercoagulable state   []$ Anemic Gastrointestinal:  []$ Diarrhea   []$ Vomiting  []$ Gastroesophageal reflux/heartburn   []$ Difficulty swallowing. Genitourinary:  []$ Chronic kidney disease   []$ Difficult urination  []$ Frequent urination   []$ Blood in urine Skin:  []$ Rashes   []$ Ulcers  Psychological:  []$ History of anxiety   []$  History of major depression.  Physical Examination  There were no vitals filed for this visit. There is no height or weight on file to calculate BMI. Gen: WD/WN, NAD Head: [place]/AT, No temporalis wasting.  Ear/Nose/Throat: Hearing grossly intact, nares w/o erythema or drainage Eyes: PER, EOMI, sclera nonicteric.  Neck: Supple, no masses.  No bruit or JVD.  Pulmonary:  Good air movement, no audible wheezing, no use of accessory muscles.  Cardiac: RRR, normal S1, S2, no Murmurs. Vascular:  carotid bruit noted Vessel Right Left  Radial Palpable Palpable  Carotid  Palpable  Palpable  Gastrointestinal: soft, non-distended. No guarding/no peritoneal signs.  Musculoskeletal: M/S 5/5 throughout.  No visible deformity.  Neurologic: CN 2-12 intact. Pain and light touch intact in extremities.  Symmetrical.  Speech is fluent. Motor exam as listed above. Psychiatric: Judgment intact, Mood & affect appropriate for pt's clinical situation. Dermatologic: No rashes or ulcers noted.  No changes consistent with cellulitis.   CBC Lab Results  Component Value Date   WBC 6.8 11/07/2022   WBC 6.6 11/07/2022   HGB 12.1 11/07/2022   HGB 11.9 (L) 11/07/2022   HCT 35.0 11/07/2022   HCT 35.8 (L) 11/07/2022   MCV 92 11/07/2022   MCV 93.5 11/07/2022   PLT 286 11/07/2022   PLT 284 11/07/2022    BMET    Component Value Date/Time   NA 137 11/07/2022 1052   K 3.8 11/07/2022 1052   CL 105 11/07/2022 1052   CO2 23 11/07/2022 1052   GLUCOSE 142 (H) 11/07/2022 1052   BUN 13 11/07/2022 1052   CREATININE 0.88 11/07/2022 1052   CALCIUM 9.9 11/07/2022 1052   GFRNONAA >60 11/07/2022  1052   GFRAA >60 02/26/2020 1026   CrCl cannot be calculated (Patient's most recent lab result is older than the maximum 21 days allowed.).  COAG Lab Results  Component Value Date   INR 0.9 08/23/2021   INR 1.02 02/10/2016    Radiology No results found.   Assessment/Plan 1. Bilateral carotid artery stenosis Recommend:   Given the patient's asymptomatic subcritical stenosis no further invasive testing or surgery at this time.   Duplex ultrasound shows 1-39% stenosis bilaterally.  Left vertebral is widely patent.   Continue antiplatelet therapy as prescribed Continue management of CAD, HTN and Hyperlipidemia Healthy heart diet,  encouraged exercise at least 4 times per week Follow up in 12 months  with duplex ultrasound and physical exam   - VAS US CAROTID; Future  2. Occlusion and stenosis of right vertebral artery See #1  3. PAD (peripheral artery disease) (HCC) Recommend:   Patient should undergo arterial duplex of the lower extremity because there has been a significant deterioration in the patient's lower extremity symptoms.  The patient states they are having increased pain and a marked decrease in the distance that they can walk.   The risks and benefits as well as the alternatives were discussed in detail with the patient.  All questions were answered.  Patient agrees to proceed and understands this could be a prelude to angiography and intervention.   The patient will follow up with me in the office to review the studies.   - VAS [place] ABI WITH/WO TBI; Future  4. Benign essential hypertension Continue antihypertensive medications as already ordered, these medications have been reviewed and there are no changes at this time.  5. Type 2 diabetes mellitus with other circulatory complication, unspecified whether long term insulin use (HCC) Continue hypoglycemic medications as already ordered, these medications have been reviewed and there are no changes at this  time.  Hgb A1C to be monitored as already arranged by primary service    Hortencia Pilar, MD  01/10/2023 3:27 PM

## 2023-01-11 ENCOUNTER — Ambulatory Visit (INDEPENDENT_AMBULATORY_CARE_PROVIDER_SITE_OTHER): Payer: Medicare HMO | Admitting: Vascular Surgery

## 2023-01-11 ENCOUNTER — Ambulatory Visit (INDEPENDENT_AMBULATORY_CARE_PROVIDER_SITE_OTHER): Payer: Medicare HMO

## 2023-01-11 ENCOUNTER — Encounter (INDEPENDENT_AMBULATORY_CARE_PROVIDER_SITE_OTHER): Payer: Self-pay | Admitting: Vascular Surgery

## 2023-01-11 VITALS — BP 140/60 | HR 60 | Resp 16 | Wt 136.0 lb

## 2023-01-11 DIAGNOSIS — I10 Essential (primary) hypertension: Secondary | ICD-10-CM

## 2023-01-11 DIAGNOSIS — I739 Peripheral vascular disease, unspecified: Secondary | ICD-10-CM

## 2023-01-11 DIAGNOSIS — I6523 Occlusion and stenosis of bilateral carotid arteries: Secondary | ICD-10-CM | POA: Diagnosis not present

## 2023-01-11 DIAGNOSIS — I6501 Occlusion and stenosis of right vertebral artery: Secondary | ICD-10-CM

## 2023-01-11 DIAGNOSIS — E1159 Type 2 diabetes mellitus with other circulatory complications: Secondary | ICD-10-CM

## 2023-01-14 ENCOUNTER — Encounter (INDEPENDENT_AMBULATORY_CARE_PROVIDER_SITE_OTHER): Payer: Self-pay | Admitting: Vascular Surgery

## 2023-01-17 LAB — VAS US ABI WITH/WO TBI
Left ABI: 1.07
Right ABI: 1.03

## 2023-02-09 ENCOUNTER — Other Ambulatory Visit: Payer: Self-pay | Admitting: Internal Medicine

## 2023-02-09 DIAGNOSIS — Z1231 Encounter for screening mammogram for malignant neoplasm of breast: Secondary | ICD-10-CM

## 2023-03-23 ENCOUNTER — Ambulatory Visit
Admission: RE | Admit: 2023-03-23 | Discharge: 2023-03-23 | Disposition: A | Discharge status: Home / Self Care | Payer: Medicare HMO | Source: Ambulatory Visit | Attending: Internal Medicine | Admitting: Internal Medicine

## 2023-03-23 DIAGNOSIS — Z1231 Encounter for screening mammogram for malignant neoplasm of breast: Secondary | ICD-10-CM | POA: Diagnosis not present

## 2023-05-08 ENCOUNTER — Other Ambulatory Visit
Admission: RE | Admit: 2023-05-08 | Discharge: 2023-05-08 | Disposition: A | Discharge status: Home / Self Care | Payer: Medicare HMO | Source: Ambulatory Visit | Attending: Infectious Diseases | Admitting: Infectious Diseases

## 2023-05-08 ENCOUNTER — Ambulatory Visit: Payer: Medicare HMO | Attending: Infectious Diseases | Admitting: Infectious Diseases

## 2023-05-08 ENCOUNTER — Encounter: Payer: Self-pay | Admitting: Infectious Diseases

## 2023-05-08 VITALS — BP 135/68 | HR 60 | Temp 96.8°F | Ht 65.0 in | Wt 137.0 lb

## 2023-05-08 DIAGNOSIS — Z79899 Other long term (current) drug therapy: Secondary | ICD-10-CM | POA: Diagnosis not present

## 2023-05-08 DIAGNOSIS — B20 Human immunodeficiency virus [HIV] disease: Secondary | ICD-10-CM | POA: Insufficient documentation

## 2023-05-08 DIAGNOSIS — Z7902 Long term (current) use of antithrombotics/antiplatelets: Secondary | ICD-10-CM | POA: Insufficient documentation

## 2023-05-08 DIAGNOSIS — I6509 Occlusion and stenosis of unspecified vertebral artery: Secondary | ICD-10-CM | POA: Insufficient documentation

## 2023-05-08 DIAGNOSIS — E785 Hyperlipidemia, unspecified: Secondary | ICD-10-CM | POA: Diagnosis not present

## 2023-05-08 DIAGNOSIS — E1140 Type 2 diabetes mellitus with diabetic neuropathy, unspecified: Secondary | ICD-10-CM | POA: Diagnosis not present

## 2023-05-08 DIAGNOSIS — I2510 Atherosclerotic heart disease of native coronary artery without angina pectoris: Secondary | ICD-10-CM | POA: Insufficient documentation

## 2023-05-08 DIAGNOSIS — I10 Essential (primary) hypertension: Secondary | ICD-10-CM | POA: Insufficient documentation

## 2023-05-08 DIAGNOSIS — Z79624 Long term (current) use of inhibitors of nucleotide synthesis: Secondary | ICD-10-CM | POA: Insufficient documentation

## 2023-05-08 DIAGNOSIS — Z7984 Long term (current) use of oral hypoglycemic drugs: Secondary | ICD-10-CM | POA: Insufficient documentation

## 2023-05-08 DIAGNOSIS — Z87891 Personal history of nicotine dependence: Secondary | ICD-10-CM | POA: Insufficient documentation

## 2023-05-08 DIAGNOSIS — Z21 Asymptomatic human immunodeficiency virus [HIV] infection status: Secondary | ICD-10-CM | POA: Diagnosis not present

## 2023-05-08 DIAGNOSIS — Z8673 Personal history of transient ischemic attack (TIA), and cerebral infarction without residual deficits: Secondary | ICD-10-CM | POA: Diagnosis not present

## 2023-05-08 DIAGNOSIS — H9192 Unspecified hearing loss, left ear: Secondary | ICD-10-CM | POA: Diagnosis not present

## 2023-05-08 MED ORDER — DOVATO 50-300 MG PO TABS: 1.0000 | ORAL_TABLET | Freq: Every day | ORAL | 5 refills | Status: DC
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
## 2023-05-09 LAB — T-HELPER CELLS CD4/CD8 %
% CD 4 Pos. Lymph.: 25.3 % — ABNORMAL LOW (ref 30.8–58.5)
Absolute CD 4 Helper: 734 /uL (ref 359–1519)
Basophils Absolute: 0.1 10*3/uL (ref 0.0–0.2)
Basos: 1 %
CD3+CD4+ Cells/CD3+CD8+ Cells Bld: 0.47 — ABNORMAL LOW (ref 0.92–3.72)
CD3+CD8+ Cells # Bld: 1549 /uL — ABNORMAL HIGH (ref 109–897)
CD3+CD8+ Cells NFr Bld: 53.4 % — ABNORMAL HIGH (ref 12.0–35.5)
EOS (ABSOLUTE): 0.1 10*3/uL (ref 0.0–0.4)
Eos: 1 %
Hematocrit: 36.7 % (ref 34.0–46.6)
Hemoglobin: 12.7 g/dL (ref 11.1–15.9)
Immature Grans (Abs): 0 10*3/uL (ref 0.0–0.1)
Immature Granulocytes: 0 %
Lymphocytes Absolute: 2.9 10*3/uL (ref 0.7–3.1)
Lymphs: 41 %
MCH: 32.7 pg (ref 26.6–33.0)
MCHC: 34.6 g/dL (ref 31.5–35.7)
MCV: 95 fL (ref 79–97)
Monocytes Absolute: 0.6 10*3/uL (ref 0.1–0.9)
Monocytes: 8 %
Neutrophils Absolute: 3.5 10*3/uL (ref 1.4–7.0)
Neutrophils: 49 %
Platelets: 262 10*3/uL (ref 150–450)
RBC: 3.88 x10E6/uL (ref 3.77–5.28)
RDW: 14.3 % (ref 11.7–15.4)
WBC: 7 10*3/uL (ref 3.4–10.8)

## 2023-05-09 LAB — RPR: RPR Ser Ql: NONREACTIVE

## 2023-05-09 LAB — HIV-1 RNA QUANT-NO REFLEX-BLD
HIV 1 RNA Quant: <20 copies/mL
LOG10 HIV-1 RNA: UNDETERMINED log10copy/mL

## 2023-05-12 LAB — QUANTIFERON-TB GOLD PLUS (RQFGPL)
QuantiFERON Mitogen Value: >10 IU/mL
QuantiFERON Nil Value: 0.17 IU/mL
QuantiFERON TB1 Ag Value: 0.25 IU/mL
QuantiFERON TB2 Ag Value: 0.18 IU/mL

## 2023-05-12 LAB — QUANTIFERON-TB GOLD PLUS: QuantiFERON-TB Gold Plus: NEGATIVE

## 2023-05-18 ENCOUNTER — Telehealth: Payer: Self-pay

## 2023-05-18 NOTE — Telephone Encounter (Signed)
-----   Message from Lynn Ito, MD sent at 05/14/2023  1:42 PM EDT ----- Please let her know that all labs look good- Vl < 20 and cd4 > 600 ----- Message ----- From: Interface, Lab In [place] Sent: 05/09/2023   8:49 AM EDT To: Lynn Ito, MD

## 2023-05-18 NOTE — Telephone Encounter (Signed)
Patient advised labs are good and verbalized understanding. Janet Keller T [name]

## 2023-05-18 NOTE — Telephone Encounter (Signed)
Patient returned call, relayed that labs were unremarkable per Dr. Rivka Safer.   Sandie Ano, RN

## 2023-05-28 ENCOUNTER — Telehealth: Payer: Self-pay

## 2023-05-28 NOTE — Telephone Encounter (Signed)
Patient left voicemail to ask provider if she should get Shingles. Informed patient that this vaccine is recommended for people over 50. Advised she contact PCP to see if they would recommend it.  Juanita Laster, RMA

## 2023-11-05 ENCOUNTER — Telehealth: Payer: Self-pay

## 2023-11-05 NOTE — Telephone Encounter (Signed)
Patient returned call to confirm appointment time. Nothing else needed. Juanita Laster, RMA

## 2023-11-05 NOTE — Telephone Encounter (Signed)
Patient left voicemail requesting call back. Did not disclose reason for call. Attempted to call her back for more information, but had to leave a voicemail for call back. Juanita Laster, RMA

## 2023-11-06 ENCOUNTER — Encounter: Payer: Self-pay | Admitting: Infectious Diseases

## 2023-11-06 ENCOUNTER — Ambulatory Visit: Payer: Medicare HMO | Attending: Infectious Diseases | Admitting: Infectious Diseases

## 2023-11-06 ENCOUNTER — Other Ambulatory Visit
Admission: RE | Admit: 2023-11-06 | Discharge: 2023-11-06 | Disposition: A | Discharge status: Home / Self Care | Payer: Medicare HMO | Source: Ambulatory Visit | Attending: Infectious Diseases | Admitting: Infectious Diseases

## 2023-11-06 VITALS — BP 154/50 | HR 79 | Temp 97.6°F | Ht 64.0 in | Wt 141.0 lb

## 2023-11-06 DIAGNOSIS — E1140 Type 2 diabetes mellitus with diabetic neuropathy, unspecified: Secondary | ICD-10-CM | POA: Insufficient documentation

## 2023-11-06 DIAGNOSIS — I6509 Occlusion and stenosis of unspecified vertebral artery: Secondary | ICD-10-CM | POA: Insufficient documentation

## 2023-11-06 DIAGNOSIS — H9192 Unspecified hearing loss, left ear: Secondary | ICD-10-CM | POA: Diagnosis not present

## 2023-11-06 DIAGNOSIS — Z21 Asymptomatic human immunodeficiency virus [HIV] infection status: Secondary | ICD-10-CM | POA: Insufficient documentation

## 2023-11-06 DIAGNOSIS — I10 Essential (primary) hypertension: Secondary | ICD-10-CM | POA: Diagnosis not present

## 2023-11-06 DIAGNOSIS — Z79899 Other long term (current) drug therapy: Secondary | ICD-10-CM | POA: Diagnosis not present

## 2023-11-06 DIAGNOSIS — I2510 Atherosclerotic heart disease of native coronary artery without angina pectoris: Secondary | ICD-10-CM | POA: Diagnosis not present

## 2023-11-06 DIAGNOSIS — Z8579 Personal history of other malignant neoplasms of lymphoid, hematopoietic and related tissues: Secondary | ICD-10-CM | POA: Diagnosis not present

## 2023-11-06 DIAGNOSIS — Z7984 Long term (current) use of oral hypoglycemic drugs: Secondary | ICD-10-CM | POA: Insufficient documentation

## 2023-11-06 DIAGNOSIS — E785 Hyperlipidemia, unspecified: Secondary | ICD-10-CM | POA: Diagnosis not present

## 2023-11-06 DIAGNOSIS — Z87891 Personal history of nicotine dependence: Secondary | ICD-10-CM | POA: Insufficient documentation

## 2023-11-06 DIAGNOSIS — E089 Diabetes mellitus due to underlying condition without complications: Secondary | ICD-10-CM | POA: Diagnosis not present

## 2023-11-06 DIAGNOSIS — Z79624 Long term (current) use of inhibitors of nucleotide synthesis: Secondary | ICD-10-CM | POA: Insufficient documentation

## 2023-11-06 DIAGNOSIS — B20 Human immunodeficiency virus [HIV] disease: Secondary | ICD-10-CM | POA: Insufficient documentation

## 2023-11-06 DIAGNOSIS — Z8673 Personal history of transient ischemic attack (TIA), and cerebral infarction without residual deficits: Secondary | ICD-10-CM | POA: Insufficient documentation

## 2023-11-06 LAB — COMPREHENSIVE METABOLIC PANEL
ALT: 24 U/L (ref 0–44)
AST: 22 U/L (ref 15–41)
Albumin: 3.8 g/dL (ref 3.5–5.0)
Alkaline Phosphatase: 66 U/L (ref 38–126)
Anion gap: 8 (ref 5–15)
BUN: 14 mg/dL (ref 8–23)
CO2: 26 mmol/L (ref 22–32)
Calcium: 9.5 mg/dL (ref 8.9–10.3)
Chloride: 102 mmol/L (ref 98–111)
Creatinine, Ser: 0.87 mg/dL (ref 0.44–1.00)
GFR, Estimated: >60 mL/min (ref >60)
Glucose, Bld: 139 mg/dL — ABNORMAL HIGH (ref 70–99)
Potassium: 3.9 mmol/L (ref 3.5–5.1)
Sodium: 136 mmol/L (ref 135–145)
Total Bilirubin: 0.7 mg/dL (ref <1.2)
Total Protein: 7.2 g/dL (ref 6.5–8.1)

## 2023-11-06 LAB — CBC WITH DIFFERENTIAL/PLATELET
Abs Immature Granulocytes: 0.02 10*3/uL (ref 0.00–0.07)
Basophils Absolute: 0 10*3/uL (ref 0.0–0.1)
Basophils Relative: 1 %
Eosinophils Absolute: 0.1 10*3/uL (ref 0.0–0.5)
Eosinophils Relative: 2 %
HCT: 37 % (ref 36.0–46.0)
Hemoglobin: 12.5 g/dL (ref 12.0–15.0)
Immature Granulocytes: 0 %
Lymphocytes Relative: 35 %
Lymphs Abs: 2.3 10*3/uL (ref 0.7–4.0)
MCH: 33.1 pg (ref 26.0–34.0)
MCHC: 33.8 g/dL (ref 30.0–36.0)
MCV: 97.9 fL (ref 80.0–100.0)
Monocytes Absolute: 0.6 10*3/uL (ref 0.1–1.0)
Monocytes Relative: 8 %
Neutro Abs: 3.6 10*3/uL (ref 1.7–7.7)
Neutrophils Relative %: 54 %
Platelets: 287 10*3/uL (ref 150–400)
RBC: 3.78 MIL/uL — ABNORMAL LOW (ref 3.87–5.11)
RDW: 14.6 % (ref 11.5–15.5)
WBC: 6.7 10*3/uL (ref 4.0–10.5)
nRBC: 0 % (ref 0.0–0.2)

## 2023-11-06 NOTE — Patient Instructions (Addendum)
You are here for follow up of HIV You are on Dovato Today will do labs Follow up 6 months
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
## 2023-11-07 LAB — T-HELPER CELLS CD4/CD8 %
% CD 4 Pos. Lymph.: 27.9 % — ABNORMAL LOW (ref 30.8–58.5)
Absolute CD 4 Helper: 670 /uL (ref 359–1519)
Basophils Absolute: 0 10*3/uL (ref 0.0–0.2)
Basos: 1 %
CD3+CD4+ Cells/CD3+CD8+ Cells Bld: 0.51 — ABNORMAL LOW (ref 0.92–3.72)
CD3+CD8+ Cells # Bld: 1308 /uL — ABNORMAL HIGH (ref 109–897)
CD3+CD8+ Cells NFr Bld: 54.5 % — ABNORMAL HIGH (ref 12.0–35.5)
EOS (ABSOLUTE): 0.1 10*3/uL (ref 0.0–0.4)
Eos: 2 %
Hematocrit: 37.4 % (ref 34.0–46.6)
Hemoglobin: 12.7 g/dL (ref 11.1–15.9)
Immature Grans (Abs): 0 10*3/uL (ref 0.0–0.1)
Immature Granulocytes: 0 %
Lymphocytes Absolute: 2.4 10*3/uL (ref 0.7–3.1)
Lymphs: 36 %
MCH: 32.9 pg (ref 26.6–33.0)
MCHC: 34 g/dL (ref 31.5–35.7)
MCV: 97 fL (ref 79–97)
Monocytes Absolute: 0.5 10*3/uL (ref 0.1–0.9)
Monocytes: 8 %
Neutrophils Absolute: 3.7 10*3/uL (ref 1.4–7.0)
Neutrophils: 53 %
Platelets: 294 10*3/uL (ref 150–450)
RBC: 3.86 x10E6/uL (ref 3.77–5.28)
RDW: 14 % (ref 11.7–15.4)
WBC: 6.8 10*3/uL (ref 3.4–10.8)

## 2023-11-07 LAB — RPR: RPR Ser Ql: NONREACTIVE

## 2023-11-07 LAB — HIV-1 RNA QUANT-NO REFLEX-BLD
HIV 1 RNA Quant: <20 {copies}/mL
LOG10 HIV-1 RNA: UNDETERMINED {Log_copies}/mL

## 2023-11-12 ENCOUNTER — Telehealth: Payer: Self-pay

## 2023-11-12 NOTE — Telephone Encounter (Signed)
-----   Message from Lynn Ito sent at 11/09/2023  2:56 PM EST ----- Please let her know that her labs look good ----- Message ----- From: Interface, Lab In [place] Sent: 11/06/2023   1:40 PM EST To: Lynn Ito, MD

## 2023-11-12 NOTE — Telephone Encounter (Signed)
Patient informed of results and verbalized understanding.  Janet Keller Jonathon Resides, CMA

## 2023-11-13 ENCOUNTER — Other Ambulatory Visit: Payer: Self-pay | Admitting: Internal Medicine

## 2023-11-13 DIAGNOSIS — N958 Other specified menopausal and perimenopausal disorders: Secondary | ICD-10-CM

## 2023-11-15 ENCOUNTER — Other Ambulatory Visit: Payer: Self-pay | Admitting: Infectious Diseases

## 2024-01-10 ENCOUNTER — Ambulatory Visit (INDEPENDENT_AMBULATORY_CARE_PROVIDER_SITE_OTHER): Payer: Medicare HMO | Admitting: Nurse Practitioner

## 2024-01-10 ENCOUNTER — Encounter (INDEPENDENT_AMBULATORY_CARE_PROVIDER_SITE_OTHER): Payer: Self-pay | Admitting: Nurse Practitioner

## 2024-01-10 ENCOUNTER — Ambulatory Visit (INDEPENDENT_AMBULATORY_CARE_PROVIDER_SITE_OTHER): Payer: Medicare HMO

## 2024-01-10 VITALS — BP 169/83 | HR 97 | Resp 18 | Ht 64.0 in | Wt 143.0 lb

## 2024-01-10 DIAGNOSIS — I6501 Occlusion and stenosis of right vertebral artery: Secondary | ICD-10-CM | POA: Diagnosis not present

## 2024-01-10 DIAGNOSIS — I739 Peripheral vascular disease, unspecified: Secondary | ICD-10-CM | POA: Diagnosis not present

## 2024-01-10 DIAGNOSIS — E785 Hyperlipidemia, unspecified: Secondary | ICD-10-CM | POA: Diagnosis not present

## 2024-01-10 DIAGNOSIS — I6523 Occlusion and stenosis of bilateral carotid arteries: Secondary | ICD-10-CM

## 2024-01-10 DIAGNOSIS — I10 Essential (primary) hypertension: Secondary | ICD-10-CM

## 2024-01-10 DIAGNOSIS — E1159 Type 2 diabetes mellitus with other circulatory complications: Secondary | ICD-10-CM

## 2024-01-11 LAB — VAS US ABI WITH/WO TBI
Left ABI: 0.86
Right ABI: 1.28

## 2024-01-13 NOTE — Progress Notes (Signed)
MRN : [id number]  Janet Keller is a 83 y.o. (December 27, 1940) female who presents with chief complaint of check carotid arteries.  History of Present Illness:  The patient is seen for follow up evaluation of vertebral artery occlusion and carotid stenosis.  The vertebral stenosis was identified after both CTA and MRA in 07/2021.  She was admitted to [facility] at that time for stroke symptoms.   The patient denies recent amaurosis fugax. There is no recent history of TIA symptoms or focal motor deficits. There is a prior documented CVA.   There is no history of migraine headaches. There is no history of seizures.   The patient is taking enteric-coated aspirin 81 mg daily with Plavix 75 mg daily and Lipitor.   The patient is denying any worsening claudication-like symptoms.   The patient does not have a history of coronary artery disease, no recent episodes of angina or shortness of breath.   There is a history of hyperlipidemia which is being treated with a statin.      Carotid duplex shows 1-39% bilateral carotid artery stenosis.  Today noninvasive studies show an ABI of 1.28 on the right and 0.6 on the left.  This is a decrease from the previous studies which show 1.03 on the right and 1.07 on the left.  She has biphasic tibial waveforms in the right.  There are biphasic/triphasic in the left but a notable absent posterior tibial artery waveform.  Current Meds  Medication Sig   acetaminophen (TYLENOL) 500 MG tablet Take 500 mg by mouth every 6 (six) hours as needed.   aspirin EC 81 MG tablet Take 81 mg by mouth daily.   atorvastatin (LIPITOR) 40 MG tablet Take 1 tablet by mouth daily.   calcium carbonate (OSCAL) 1500 (600 Ca) MG TABS tablet Take 1,500 mg by mouth daily with breakfast.   clopidogrel (PLAVIX) 75 MG tablet Take 1 tablet (75 mg total) by mouth daily.   DOVATO 50-300 MG tablet TAKE 1 TABLET BY MOUTH EVERY DAY   enalapril (VASOTEC) 10 MG tablet Take 2 tablets by mouth 2  (two) times daily.   estradiol (ESTRACE) 0.1 MG/GM vaginal cream Place 0.5 application vaginally once a week.   fluticasone (FLONASE) 50 MCG/ACT nasal spray Place 1 spray into both nostrils daily.   fluticasone (FLONASE) 50 MCG/ACT nasal spray Place into the nose.   metFORMIN (GLUCOPHAGE) 500 MG tablet Take 1 tablet by mouth 2 (two) times daily with a meal.   metoprolol tartrate (LOPRESSOR) 25 MG tablet Take 25 mg by mouth 2 (two) times daily.    Multiple Vitamins-Minerals (MULTIVITAMIN ADULT PO) Take 1 tablet by mouth daily.   nystatin ointment (MYCOSTATIN) Apply 1 application topically 2 (two) times daily as needed (skin irritation).    ONETOUCH VERIO test strip 2 (two) times daily.   triamcinolone cream (KENALOG) 0.1 % Apply 1 Application topically 2 (two) times daily.    Past Medical History:  Diagnosis Date   Arthritis    osteoarthritis   Breast pain    Diabetes mellitus without complication (HCC)    GERD (gastroesophageal reflux disease)    HIV (human immunodeficiency virus infection) (HCC)    Hyperlipidemia    Hypertension    MALT lymphoma     Past Surgical History:  Procedure Laterality Date   BREAST BIOPSY Left 1985   neg   CATARACT EXTRACTION W/ INTRAOCULAR LENS  IMPLANT, BILATERAL     HAMMER TOE SURGERY Bilateral 02/01/2018  Procedure: HAMMER TOE CORRECTION/Bil 5TH toes;  Surgeon: Linus Galas, DPM;  Location: [facility] ORS;  Service: Podiatry;  Laterality: Bilateral;   HYSTEROSCOPY WITH D & C N/A 02/18/2016   Procedure: Fractional DILATATION AND CURETTAGE /HYSTEROSCOPY , Polypectomy and myomectomy;  Surgeon: Christeen Douglas, MD;  Location: [facility];  Service: Gynecology;  Laterality: N/A;   TUBAL LIGATION      Social History Social History   Tobacco Use   Smoking status: Former   Smokeless tobacco: Never  Vaping Use   Vaping status: Never Used  Substance Use Topics   Alcohol use: Yes    Alcohol/week: 1.0 standard drink of alcohol    Types: 1 Glasses of wine per  week    Comment: previous ETOH abuse   Drug use: No    Family History Family History  Problem Relation Age of Onset   Breast cancer Mother 93    No Known Allergies   REVIEW OF SYSTEMS (Negative unless checked)  Constitutional: [] Weight loss  [] Fever  [] Chills Cardiac: [] Chest pain   [] Chest pressure   [] Palpitations   [] Shortness of breath when laying flat   [] Shortness of breath with exertion. Vascular:  [] Pain in legs with walking   [] Pain in legs at rest  [] History of DVT   [] Phlebitis   [] Swelling in legs   [] Varicose veins   [] Non-healing ulcers Pulmonary:   [] Uses home oxygen   [] Productive cough   [] Hemoptysis   [] Wheeze  [] COPD   [] Asthma Neurologic:  [] Dizziness   [] Seizures   [] History of stroke   [] History of TIA  [] Aphasia   [] Vissual changes   [] Weakness or numbness in arm   [] Weakness or numbness in leg Musculoskeletal:   [] Joint swelling   [] Joint pain   [] Low back pain Hematologic:  [] Easy bruising  [] Easy bleeding   [] Hypercoagulable state   [] Anemic Gastrointestinal:  [] Diarrhea   [] Vomiting  [] Gastroesophageal reflux/heartburn   [] Difficulty swallowing. Genitourinary:  [] Chronic kidney disease   [] Difficult urination  [] Frequent urination   [] Blood in urine Skin:  [] Rashes   [] Ulcers  Psychological:  [] History of anxiety   []  History of major depression.  Physical Examination  Vitals:   01/10/24 1440 01/10/24 1442  BP: (!) 171/85 (!) 169/83  Pulse: 97 97  Resp: 18 18  Weight: 143 lb (64.9 kg)   Height: 5\' 4"  (1.626 m)    Body mass index is 24.55 kg/m. Gen: WD/WN, NAD Head: [place]/AT, No temporalis wasting.  Ear/Nose/Throat: Hearing grossly intact, nares w/o erythema or drainage Eyes: PER, EOMI, sclera nonicteric.  Neck: Supple, no masses.  No bruit or JVD.  Pulmonary:  Good air movement, no audible wheezing, no use of accessory muscles.  Cardiac: RRR, normal S1, S2, no Murmurs. Vascular:  carotid bruit noted Vessel Right Left  Radial Palpable Palpable   Carotid  Palpable  Palpable  Gastrointestinal: soft, non-distended. No guarding/no peritoneal signs.  Musculoskeletal: M/S 5/5 throughout.  No visible deformity.  Neurologic: CN 2-12 intact. Pain and light touch intact in extremities.  Symmetrical.  Speech is fluent. Motor exam as listed above. Psychiatric: Judgment intact, Mood & affect appropriate for pt's clinical situation. Dermatologic: No rashes or ulcers noted.  No changes consistent with cellulitis.   CBC Lab Results  Component Value Date   WBC 6.7 11/06/2023   WBC 6.8 11/06/2023   HGB 12.5 11/06/2023   HGB 12.7 11/06/2023   HCT 37.0 11/06/2023   HCT 37.4 11/06/2023   MCV 97.9 11/06/2023   MCV 97  11/06/2023   PLT 287 11/06/2023   PLT 294 11/06/2023    BMET    Component Value Date/Time   NA 136 11/06/2023 1257   K 3.9 11/06/2023 1257   CL 102 11/06/2023 1257   CO2 26 11/06/2023 1257   GLUCOSE 139 (H) 11/06/2023 1257   BUN 14 11/06/2023 1257   CREATININE 0.87 11/06/2023 1257   CALCIUM 9.5 11/06/2023 1257   GFRNONAA >60 11/06/2023 1257   GFRAA >60 02/26/2020 1026   CrCl cannot be calculated (Patient's most recent lab result is older than the maximum 21 days allowed.).  COAG Lab Results  Component Value Date   INR 0.9 08/23/2021   INR 1.02 02/10/2016    Radiology VAS [place] ABI WITH/WO TBI Result Date: 01/11/2024  LOWER EXTREMITY DOPPLER STUDY Patient Name:  NEVIN KOZUCH  Date of Exam:   01/10/2024 Medical Rec #: [id number]           Accession #:    [id number] Date of Birth: Apr 06, 1941            Patient Gender: F Patient Age:   67 years Exam Location:  [facility] Procedure:      VAS [place] ABI WITH/WO TBI Referring Phys: [facility] --------------------------------------------------------------------------------  Indications: Claudication, and peripheral artery disease. High Risk         Hypertension, hyperlipidemia, past history of smoking, prior Factors:          CVA.  Performing Technologist:  Hardie Lora RVT  Examination Guidelines: A complete evaluation includes at minimum, Doppler waveform signals and systolic blood pressure reading at the level of bilateral brachial, anterior tibial, and posterior tibial arteries, when vessel segments are accessible. Bilateral testing is considered an integral part of a complete examination. Photoelectric Plethysmograph (PPG) waveforms and toe systolic pressure readings are included as required and additional duplex testing as needed. Limited examinations for reoccurring indications may be performed as noted.  ABI Findings: +---------+------------------+-----+--------+--------+ Right    Rt Pressure (mmHg)IndexWaveformComment  +---------+------------------+-----+--------+--------+ Brachial 127                                     +---------+------------------+-----+--------+--------+ PTA      78                0.55 biphasic         +---------+------------------+-----+--------+--------+ DP       181               1.28 biphasic         +---------+------------------+-----+--------+--------+ Great Toe99                0.70                  +---------+------------------+-----+--------+--------+ +---------+------------------+-----+---------+-------+ Left     Lt Pressure (mmHg)IndexWaveform Comment +---------+------------------+-----+---------+-------+ Brachial 141                                     +---------+------------------+-----+---------+-------+ PTA      0                 0.00 absent           +---------+------------------+-----+---------+-------+ PERO     108               0.85 biphasic         +---------+------------------+-----+---------+-------+  DP       121               0.86 triphasic        +---------+------------------+-----+---------+-------+ Great Toe94                0.67                  +---------+------------------+-----+---------+-------+  +-------+-----------+-----------+------------+------------+ ABI/TBIToday's ABIToday's TBIPrevious ABIPrevious TBI +-------+-----------+-----------+------------+------------+ Right  1.28       0.70       1.03        0.58         +-------+-----------+-----------+------------+------------+ Left   0.86       0.67       1.07        0.75         +-------+-----------+-----------+------------+------------+ Left ABI is stable compared to study performed in 06/2022. Right ABIs appear essentially unchanged compared to prior study on 01/11/2023. Left ABIs appear decreased compared to prior study on 01/11/2023. Left ABI is stable compared to study performed in 06/2022.  Summary: Right: Resting right ankle-brachial index is within normal range. The right toe-brachial index is normal. Left: Resting left ankle-brachial index indicates mild left lower extremity arterial disease. *See table(s) above for measurements and observations.  Electronically signed by Levora Dredge MD on 01/11/2024 at 9:45:02 AM.    Final    VAS US CAROTID Result Date: 01/11/2024 Carotid Arterial Duplex Study Patient Name:  ANDALYN HECKSTALL  Date of Exam:   01/10/2024 Medical Rec #: [id number]           Accession #:    [id number] Date of Birth: [birth year]            Patient Gender: F Patient Age:   34 years Exam Location:  [facility] Procedure:      VAS US CAROTID Referring Phys: Levora Dredge --------------------------------------------------------------------------------  Indications:  CVA and Carotid artery disease. Risk Factors: Hypertension, hyperlipidemia, Diabetes, past history of smoking,               prior CVA. Performing Technologist: Hardie Lora RVT  Examination Guidelines: A complete evaluation includes B-mode imaging, spectral Doppler, color Doppler, and power Doppler as needed of all accessible portions of each vessel. Bilateral testing is considered an integral part of a complete examination. Limited  examinations for reoccurring indications may be performed as noted.  Right Carotid Findings: +----------+--------+--------+--------+--------------------------+--------+           PSV cm/sEDV cm/sStenosisPlaque Description        Comments +----------+--------+--------+--------+--------------------------+--------+ CCA Prox  93      21                                                 +----------+--------+--------+--------+--------------------------+--------+ CCA Mid   81      20                                                 +----------+--------+--------+--------+--------------------------+--------+ CCA Distal69      15      <50%    calcific                           +----------+--------+--------+--------+--------------------------+--------+  ICA Prox  40      11      1-39%   irregular and heterogenous         +----------+--------+--------+--------+--------------------------+--------+ ICA Mid   67      19                                                 +----------+--------+--------+--------+--------------------------+--------+ ICA Distal51      18                                                 +----------+--------+--------+--------+--------------------------+--------+ ECA       73      0                                                  +----------+--------+--------+--------+--------------------------+--------+ +----------+--------+-------+----------------+-------------------+           PSV cm/sEDV cmsDescribe        Arm Pressure (mmHG) +----------+--------+-------+----------------+-------------------+ [id number]      0      Multiphasic, WNL                    +----------+--------+-------+----------------+-------------------+ +---------+--------+-+--------+-------------------------+ VertebralPSV cm/s0EDV cm/sNot identified and Absent +---------+--------+-+--------+-------------------------+  Left Carotid Findings:  +----------+--------+--------+--------+------------------------+--------+           PSV cm/sEDV cm/sStenosisPlaque Description      Comments +----------+--------+--------+--------+------------------------+--------+ CCA Prox  99      16                                               +----------+--------+--------+--------+------------------------+--------+ CCA Mid   81      15                                               +----------+--------+--------+--------+------------------------+--------+ CCA Distal91      17                                               +----------+--------+--------+--------+------------------------+--------+ ICA Prox  81      20      1-39%   diffuse and heterogenous         +----------+--------+--------+--------+------------------------+--------+ ICA Mid   71      19                                               +----------+--------+--------+--------+------------------------+--------+ ICA Distal88      20                                               +----------+--------+--------+--------+------------------------+--------+  ECA       62      10                                               +----------+--------+--------+--------+------------------------+--------+ +----------+--------+--------+----------------+-------------------+           PSV cm/sEDV cm/sDescribe        Arm Pressure (mmHG) +----------+--------+--------+----------------+-------------------+ [id number]              Multiphasic, WNL                    +----------+--------+--------+----------------+-------------------+ +---------+--------+--+--------+--+---------+ VertebralPSV cm/s42EDV cm/s13Antegrade +---------+--------+--+--------+--+---------+   Summary: Right Carotid: Velocities in the right ICA are consistent with a 1-39% stenosis.                Non-hemodynamically significant plaque <50% noted in the CCA. Left Carotid: Velocities in the left ICA are  consistent with a 1-39% stenosis. Vertebrals:  Left vertebral artery demonstrates antegrade flow. Right vertebral              artery demonstrates no discernable flow. Subclavians: Normal flow hemodynamics were seen in bilateral subclavian              arteries. *See table(s) above for measurements and observations.  Electronically signed by Levora Dredge MD on 01/11/2024 at 9:44:54 AM.    Final      Assessment/Plan 1. Bilateral carotid artery stenosis Recommend:   Given the patient's asymptomatic subcritical stenosis no further invasive testing or surgery at this time.   Duplex ultrasound shows 1-39% stenosis bilaterally.  Left vertebral is widely patent.   Continue antiplatelet therapy as prescribed Continue management of CAD, HTN and Hyperlipidemia Healthy heart diet,  encouraged exercise at least 4 times per week Follow up in 12 months with duplex ultrasound and physical exam     2. Occlusion and stenosis of right vertebral artery See #1  3. PAD (peripheral artery disease) (HCC) Recommend:   Currently the patient denies any claudication-like symptoms.  Her studies have certainly decreased on her left.  Given the decrease we will follow-up with [place] in 6 months versus annually.  She is again advised if she begins to have worsening claudication-like symptoms, rest pain or open wounds or ulcerations to follow-up with [place] sooner.   4. Benign essential hypertension Continue antihypertensive medications as already ordered, these medications have been reviewed and there are no changes at this time.  5. Type 2 diabetes mellitus with other circulatory complication, unspecified whether long term insulin use (HCC) Continue hypoglycemic medications as already ordered, these medications have been reviewed and there are no changes at this time.  Hgb A1C to be monitored as already arranged by primary service    Georgiana Spinner, NP  01/13/2024 2:41 AM

## 2024-01-30 ENCOUNTER — Ambulatory Visit
Admission: RE | Admit: 2024-01-30 | Discharge: 2024-01-30 | Disposition: A | Discharge status: Home / Self Care | Payer: Medicare HMO | Source: Ambulatory Visit | Attending: Internal Medicine | Admitting: Internal Medicine

## 2024-01-30 DIAGNOSIS — N958 Other specified menopausal and perimenopausal disorders: Secondary | ICD-10-CM | POA: Diagnosis present

## 2024-02-20 ENCOUNTER — Other Ambulatory Visit: Payer: Self-pay | Admitting: Internal Medicine

## 2024-02-20 DIAGNOSIS — Z1231 Encounter for screening mammogram for malignant neoplasm of breast: Secondary | ICD-10-CM

## 2024-02-25 ENCOUNTER — Other Ambulatory Visit: Payer: Self-pay

## 2024-02-25 MED ORDER — DOVATO 50-300 MG PO TABS: 1.0000 | ORAL_TABLET | Freq: Every day | ORAL | 5 refills | Status: DC

## 2024-03-24 ENCOUNTER — Ambulatory Visit
Admission: RE | Admit: 2024-03-24 | Discharge: 2024-03-24 | Disposition: A | Discharge status: Home / Self Care | Source: Ambulatory Visit | Attending: Internal Medicine | Admitting: Internal Medicine

## 2024-03-24 DIAGNOSIS — Z1231 Encounter for screening mammogram for malignant neoplasm of breast: Secondary | ICD-10-CM | POA: Insufficient documentation

## 2024-04-08 NOTE — Progress Notes (Unsigned)
 MRN : [id number]  Janet Keller is a 83 y.o. ([birth year]) female who presents with chief complaint of check carotid arteries.  History of Present Illness:   The patient is seen for follow up evaluation of vertebral artery occlusion and carotid stenosis.  The vertebral stenosis was identified after both CTA and MRA in 07/2021.  She was admitted to [facility] at that time for stroke symptoms.   The patient denies recent amaurosis fugax. There is no recent history of TIA symptoms or focal motor deficits. There is a prior documented CVA.   There is no history of migraine headaches. There is no history of seizures.   The patient is taking enteric-coated aspirin  81 mg daily with Plavix  75 mg daily and Lipitor.   The patient is denying any worsening claudication-like symptoms.   The patient does not have a history of coronary artery disease, no recent episodes of angina or shortness of breath.   There is a history of hyperlipidemia which is being treated with a statin.       Carotid duplex shows 1-39% bilateral carotid artery stenosis.   Today noninvasive studies show an ABI of 1.28 on the right and 0.6 on the left.  This is a decrease from the previous studies which show 1.03 on the right and 1.07 on the left.  She has biphasic tibial waveforms in the right.  There are biphasic/triphasic in the left but a notable absent posterior tibial artery waveform.  No outpatient medications have been marked as taking for the 04/10/24 encounter (Appointment) with Prescilla Brod, Ninette Basque, MD.    Past Medical History:  Diagnosis Date   Arthritis    osteoarthritis   Breast pain    Diabetes mellitus without complication (HCC)    GERD (gastroesophageal reflux disease)    HIV (human immunodeficiency virus infection) (HCC)    Hyperlipidemia    Hypertension    MALT lymphoma     Past Surgical History:  Procedure Laterality Date   BREAST BIOPSY Left 1985   neg   CATARACT EXTRACTION  W/ INTRAOCULAR LENS  IMPLANT, BILATERAL     HAMMER TOE SURGERY Bilateral 02/01/2018   Procedure: HAMMER TOE CORRECTION/Bil 5TH toes;  Surgeon: Angel Barba, DPM;  Location: [facility] ORS;  Service: Podiatry;  Laterality: Bilateral;   HYSTEROSCOPY WITH D & C N/A 02/18/2016   Procedure: Fractional DILATATION AND CURETTAGE /HYSTEROSCOPY , Polypectomy and myomectomy;  Surgeon: Prescilla Brod, MD;  Location: [facility] ORS;  Service: Gynecology;  Laterality: N/A;   TUBAL LIGATION      Social History Social History   Tobacco Use   Smoking status: Former   Smokeless tobacco: Never  Vaping Use   Vaping status: Never Used  Substance Use Topics   Alcohol use: Yes    Alcohol/week: 1.0 standard drink of alcohol    Types: 1 Glasses of wine per week    Comment: previous ETOH abuse   Drug use: No    Family History Family History  Problem Relation Age of Onset   Breast cancer Mother 60    No Known Allergies   REVIEW OF SYSTEMS (Negative unless checked)  Constitutional: [] Weight loss  [] Fever  [] Chills Cardiac: [] Chest pain   [] Chest pressure   [] Palpitations   [] Shortness of breath when laying flat   [] Shortness of breath with exertion. Vascular:  [x] Pain in legs with walking   []   Pain in legs at rest  [] History of DVT   [] Phlebitis   [] Swelling in legs   [] Varicose veins   [] Non-healing ulcers Pulmonary:   [] Uses home oxygen   [] Productive cough   [] Hemoptysis   [] Wheeze  [] COPD   [] Asthma Neurologic:  [] Dizziness   [] Seizures   [] History of stroke   [] History of TIA  [] Aphasia   [] Vissual changes   [] Weakness or numbness in arm   [] Weakness or numbness in leg Musculoskeletal:   [] Joint swelling   [] Joint pain   [] Low back pain Hematologic:  [] Easy bruising  [] Easy bleeding   [] Hypercoagulable state   [] Anemic Gastrointestinal:  [] Diarrhea   [] Vomiting  [] Gastroesophageal reflux/heartburn   [] Difficulty swallowing. Genitourinary:  [] Chronic kidney disease   [] Difficult urination  [] Frequent urination    [] Blood in urine Skin:  [] Rashes   [] Ulcers  Psychological:  [] History of anxiety   []  History of major depression.  Physical Examination  There were no vitals filed for this visit. There is no height or weight on file to calculate BMI. Gen: WD/WN, NAD Head: [place]/AT, No temporalis wasting.  Ear/Nose/Throat: Hearing grossly intact, nares w/o erythema or drainage Eyes: PER, EOMI, sclera nonicteric.  Neck: Supple, no masses.  No bruit or JVD.  Pulmonary:  Good air movement, no audible wheezing, no use of accessory muscles.  Cardiac: RRR, normal S1, S2, no Murmurs. Vascular:  carotid bruit noted Vessel Right Left  Radial Palpable Palpable  Carotid  Palpable  Palpable  Subclav  Not Palpable Palpable  Gastrointestinal: soft, non-distended. No guarding/no peritoneal signs.  Musculoskeletal: M/S 5/5 throughout.  No visible deformity.  Neurologic: CN 2-12 intact. Pain and light touch intact in extremities.  Symmetrical.  Speech is fluent. Motor exam as listed above. Psychiatric: Judgment intact, Mood & affect appropriate for pt's clinical situation. Dermatologic: No rashes or ulcers noted.  No changes consistent with cellulitis.   CBC Lab Results  Component Value Date   WBC 6.7 11/06/2023   WBC 6.8 11/06/2023   HGB 12.5 11/06/2023   HGB 12.7 11/06/2023   HCT 37.0 11/06/2023   HCT 37.4 11/06/2023   MCV 97.9 11/06/2023   MCV 97 11/06/2023   PLT 287 11/06/2023   PLT 294 11/06/2023    BMET    Component Value Date/Time   NA 136 11/06/2023 1257   K 3.9 11/06/2023 1257   CL 102 11/06/2023 1257   CO2 26 11/06/2023 1257   GLUCOSE 139 (H) 11/06/2023 1257   BUN 14 11/06/2023 1257   CREATININE 0.87 11/06/2023 1257   CALCIUM  9.5 11/06/2023 1257   GFRNONAA >60 11/06/2023 1257   GFRAA >60 02/26/2020 1026   CrCl cannot be calculated (Patient's most recent lab result is older than the maximum 21 days allowed.).  COAG Lab Results  Component Value Date   INR 0.9 08/23/2021   INR 1.02  02/10/2016    Radiology MM 3D SCREENING MAMMOGRAM BILATERAL BREAST Result Date: 03/26/2024 CLINICAL DATA:  Screening. EXAM: DIGITAL SCREENING BILATERAL MAMMOGRAM WITH TOMOSYNTHESIS AND CAD TECHNIQUE: Bilateral screening digital craniocaudal and mediolateral oblique mammograms were obtained. Bilateral screening digital breast tomosynthesis was performed. The images were evaluated with computer-aided detection. COMPARISON:  Previous exam(s). ACR Breast Density Category c: The breasts are heterogeneously dense, which may obscure small masses. FINDINGS: There are no findings suspicious for malignancy. IMPRESSION: No mammographic evidence of malignancy. A result letter of this screening mammogram will be mailed directly to the patient. RECOMMENDATION: Screening mammogram in one year. (Code:SM-B-01Y) BI-RADS CATEGORY  1: Negative. Electronically Signed   By: Allena Ito M.D.   On: 03/26/2024 15:08     Assessment/Plan 1. Bilateral carotid artery stenosis (Primary) Recommend:  Given the patient's asymptomatic subcritical stenosis no further invasive testing or surgery at this time.  Duplex ultrasound shows 1-39% stenosis bilaterally.  Continue antiplatelet therapy as prescribed Continue management of CAD, HTN and Hyperlipidemia Healthy heart diet,  encouraged exercise at least 4 times per week  Follow up in 12 months with duplex ultrasound and physical exam   2. Occlusion and stenosis of right vertebral artery Recommend:  Given the patient's asymptomatic occlusion of the right vertebral artery no further invasive testing or surgery at this time.  Duplex ultrasound shows 1-39% stenosis bilaterally.  The study also confirms occlusion with patency of the left vertebral artery  Continue antiplatelet therapy as prescribed Continue management of CAD, HTN and Hyperlipidemia Healthy heart diet,  encouraged exercise at least 4 times per week  Follow up in 12 months with duplex ultrasound and  physical exam   3. PAD (peripheral artery disease) (HCC)  Recommend:  The patient has evidence of atherosclerosis of the lower extremities with claudication.  The patient does not voice lifestyle limiting changes at this point in time.  Noninvasive studies do not suggest clinically significant change.  No invasive studies, angiography or surgery at this time The patient should continue walking and begin a more formal exercise program.  The patient should continue antiplatelet therapy and aggressive treatment of the lipid abnormalities  No changes in the patient's medications at this time  Continued surveillance is indicated as atherosclerosis is likely to progress with time.    The patient will continue follow up with noninvasive studies as ordered.   4. Benign essential hypertension Continue antihypertensive medications as already ordered, these medications have been reviewed and there are no changes at this time.  5. Type 2 diabetes mellitus with other circulatory complication, unspecified whether long term insulin  use (HCC) Continue hypoglycemic medications as already ordered, these medications have been reviewed and there are no changes at this time.  Hgb A1C to be monitored as already arranged by primary service  6. Mixed hyperlipidemia Continue statin as ordered and reviewed, no changes at this time    Devon Fogo, MD  04/08/2024 8:58 PM

## 2024-04-09 ENCOUNTER — Other Ambulatory Visit (INDEPENDENT_AMBULATORY_CARE_PROVIDER_SITE_OTHER): Payer: Self-pay | Admitting: Nurse Practitioner

## 2024-04-09 DIAGNOSIS — M25572 Pain in left ankle and joints of left foot: Secondary | ICD-10-CM

## 2024-04-09 DIAGNOSIS — I739 Peripheral vascular disease, unspecified: Secondary | ICD-10-CM

## 2024-04-10 ENCOUNTER — Encounter (INDEPENDENT_AMBULATORY_CARE_PROVIDER_SITE_OTHER): Payer: Self-pay | Admitting: Vascular Surgery

## 2024-04-10 ENCOUNTER — Ambulatory Visit (INDEPENDENT_AMBULATORY_CARE_PROVIDER_SITE_OTHER): Payer: Medicare HMO | Admitting: Vascular Surgery

## 2024-04-10 ENCOUNTER — Ambulatory Visit (INDEPENDENT_AMBULATORY_CARE_PROVIDER_SITE_OTHER): Payer: Medicare HMO

## 2024-04-10 VITALS — BP 138/76 | HR 71 | Resp 16 | Wt 140.0 lb

## 2024-04-10 DIAGNOSIS — I10 Essential (primary) hypertension: Secondary | ICD-10-CM | POA: Diagnosis not present

## 2024-04-10 DIAGNOSIS — M25571 Pain in right ankle and joints of right foot: Secondary | ICD-10-CM | POA: Diagnosis not present

## 2024-04-10 DIAGNOSIS — I739 Peripheral vascular disease, unspecified: Secondary | ICD-10-CM

## 2024-04-10 DIAGNOSIS — I6501 Occlusion and stenosis of right vertebral artery: Secondary | ICD-10-CM | POA: Diagnosis not present

## 2024-04-10 DIAGNOSIS — E1159 Type 2 diabetes mellitus with other circulatory complications: Secondary | ICD-10-CM

## 2024-04-10 DIAGNOSIS — I6523 Occlusion and stenosis of bilateral carotid arteries: Secondary | ICD-10-CM

## 2024-04-10 DIAGNOSIS — M25572 Pain in left ankle and joints of left foot: Secondary | ICD-10-CM | POA: Diagnosis not present

## 2024-04-10 DIAGNOSIS — E782 Mixed hyperlipidemia: Secondary | ICD-10-CM

## 2024-04-14 ENCOUNTER — Other Ambulatory Visit (INDEPENDENT_AMBULATORY_CARE_PROVIDER_SITE_OTHER): Payer: Self-pay | Admitting: Vascular Surgery

## 2024-04-14 DIAGNOSIS — M79601 Pain in right arm: Secondary | ICD-10-CM

## 2024-04-14 LAB — VAS US ABI WITH/WO TBI
Left ABI: 1.01
Right ABI: 1.1

## 2024-04-15 ENCOUNTER — Ambulatory Visit (INDEPENDENT_AMBULATORY_CARE_PROVIDER_SITE_OTHER)

## 2024-04-15 ENCOUNTER — Encounter (INDEPENDENT_AMBULATORY_CARE_PROVIDER_SITE_OTHER): Payer: Self-pay

## 2024-04-15 ENCOUNTER — Ambulatory Visit (INDEPENDENT_AMBULATORY_CARE_PROVIDER_SITE_OTHER): Admitting: Nurse Practitioner

## 2024-04-15 VITALS — BP 127/66 | HR 69 | Resp 17 | Ht 64.0 in | Wt 140.8 lb

## 2024-04-15 DIAGNOSIS — M79601 Pain in right arm: Secondary | ICD-10-CM

## 2024-04-15 DIAGNOSIS — I10 Essential (primary) hypertension: Secondary | ICD-10-CM

## 2024-04-15 DIAGNOSIS — I6501 Occlusion and stenosis of right vertebral artery: Secondary | ICD-10-CM

## 2024-04-16 ENCOUNTER — Encounter (INDEPENDENT_AMBULATORY_CARE_PROVIDER_SITE_OTHER): Payer: Self-pay | Admitting: Nurse Practitioner

## 2024-04-16 NOTE — Progress Notes (Signed)
 Subjective:    Patient ID: Janet Keller, female    DOB: [birth year], 83 y.o.   MRN: [id number] Chief Complaint  Patient presents with   Venous Insufficiency    The patient is an 83 year old female who returns today for follow-up evaluation numbness and tingling discomfort in her right upper extremity.  This happened after recent vaccine.  She notes that the pain began a few like electrical signals down her arm.  She also had an area of an which feels to be swollen.  She has a history of right vertebral artery occlusion but with no evidence of subclavian steal.  Today her noninvasive studies show no obstruction visualized in the right upper extremity.  No obvious obstruction or venous thrombus seen at any of the symptomatic areas such as her antecubital fossa.  She has triphasic waveforms throughout the upper extremity with finger waveforms.    Review of Systems  Neurological:  Positive for numbness.  All other systems reviewed and are negative.      Objective:    Physical Exam Vitals reviewed.  HENT:     Head: Normocephalic.  Cardiovascular:     Rate and Rhythm: Normal rate.     Pulses:          Radial pulses are 1+ on the right side and 1+ on the left side.  Pulmonary:     Effort: Pulmonary effort is normal.  Skin:    General: Skin is warm and dry.  Neurological:     Mental Status: She is alert and oriented to person, place, and time.  Psychiatric:        Mood and Affect: Mood normal.        Behavior: Behavior normal.        Thought Content: Thought content normal.        Judgment: Judgment normal.     BP 127/66 (BP Location: Left Arm, Patient Position: Sitting, Cuff Size: Normal)   Pulse 69   Resp 17   Ht 5\' 4"  (1.626 m)   Wt 140 lb 12.8 oz (63.9 kg)   BMI 24.17 kg/m   Past Medical History:  Diagnosis Date   Arthritis    osteoarthritis   Breast pain    Diabetes mellitus without complication (HCC)    GERD (gastroesophageal reflux disease)    HIV (human  immunodeficiency virus infection) (HCC)    Hyperlipidemia    Hypertension    MALT lymphoma     Social History   Socioeconomic History   Marital status: Single    Spouse name: Not on file   Number of children: Not on file   Years of education: Not on file   Highest education level: Not on file  Occupational History   Not on file  Tobacco Use   Smoking status: Former   Smokeless tobacco: Never  Vaping Use   Vaping status: Never Used  Substance and Sexual Activity   Alcohol use: Yes    Alcohol/week: 1.0 standard drink of alcohol    Types: 1 Glasses of wine per week    Comment: previous ETOH abuse   Drug use: No   Sexual activity: Not on file  Other Topics Concern   Not on file  Social History Narrative   Not on file   Social Drivers of Health   Financial Resource Strain: Low Risk  (11/05/2023)   [facility]   Overall Financial Resource Strain (CARDIA)    Difficulty of Paying Living  Expenses: Not very hard  Food Insecurity: No Food Insecurity (01/25/2024)   Received from [facility]   Hunger Vital Sign    Worried About Running Out of Food in the Last Year: Never true    Ran Out of Food in the Last Year: Never true  Recent Concern: Food Insecurity - Food Insecurity Present (11/05/2023)   Received from [facility]   Hunger Vital Sign    Worried About Running Out of Food in the Last Year: Sometimes true    Ran Out of Food in the Last Year: Sometimes true  Transportation Needs: No Transportation Needs (01/25/2024)   Received from [facility]    Lack of Transportation (Medical): No    Lack of Transportation (Non-Medical): No  Physical Activity: Not on file  Stress: Not on file  Social Connections: Not on file  Intimate Partner Violence: Not on file    Past Surgical History:  Procedure Laterality Date   BREAST BIOPSY Left 1985   neg   CATARACT EXTRACTION W/ INTRAOCULAR LENS  IMPLANT,  BILATERAL     HAMMER TOE SURGERY Bilateral 02/01/2018   Procedure: HAMMER TOE CORRECTION/Bil 5TH toes;  Surgeon: Angel Barba, DPM;  Location: [facility] ORS;  Service: Podiatry;  Laterality: Bilateral;   HYSTEROSCOPY WITH D & C N/A 02/18/2016   Procedure: Fractional DILATATION AND CURETTAGE /HYSTEROSCOPY , Polypectomy and myomectomy;  Surgeon: Prescilla Brod, MD;  Location: [facility] ORS;  Service: Gynecology;  Laterality: N/A;   TUBAL LIGATION      Family History  Problem Relation Age of Onset   Breast cancer Mother 43    No Known Allergies     Latest Ref Rng & Units 11/06/2023   12:57 PM 05/08/2023   12:05 PM 11/07/2022   10:52 AM  CBC  WBC 4.0 - 10.5 K/uL 3.4 - 10.8 x10E3/uL 6.7    6.8  7.0  6.8    6.6   Hemoglobin 12.0 - 15.0 g/dL 14.7 - 82.9 g/dL 56.2    13.0  86.5  78.4    11.9   Hematocrit 36.0 - 46.0 % 34.0 - 46.6 % 37.0    37.4  36.7  35.0    35.8   Platelets 150 - 400 K/uL 150 - 450 x10E3/uL 287    294  262  286    284        CMP     Component Value Date/Time   NA 136 11/06/2023 1257   K 3.9 11/06/2023 1257   CL 102 11/06/2023 1257   CO2 26 11/06/2023 1257   GLUCOSE 139 (H) 11/06/2023 1257   BUN 14 11/06/2023 1257   CREATININE 0.87 11/06/2023 1257   CALCIUM  9.5 11/06/2023 1257   PROT 7.2 11/06/2023 1257   ALBUMIN 3.8 11/06/2023 1257   AST 22 11/06/2023 1257   ALT 24 11/06/2023 1257   ALKPHOS 66 11/06/2023 1257   BILITOT 0.7 11/06/2023 1257   GFRNONAA >60 11/06/2023 1257     VAS US  ABI WITH/WO TBI Result Date: 04/14/2024  LOWER EXTREMITY DOPPLER STUDY Patient Name:  Janet Keller  Date of Exam:   04/10/2024 Medical Rec #: [id number]           Accession #:    [id number] Date of Birth: 1941/06/27            Patient Gender: F Patient Age:   83 years Exam [facility] Procedure:  VAS US  ABI WITH/WO TBI Referring Phys: --------------------------------------------------------------------------------  Indications: Claudication, and  peripheral artery disease. High Risk Factors: Hypertension, hyperlipidemia, prior CVA.  Comparison Study: 01/11/2024 Performing Technologist: Faustine Hoof RVT  Examination Guidelines: A complete evaluation includes at minimum, Doppler waveform signals and systolic blood pressure reading at the level of bilateral brachial, anterior tibial, and posterior tibial arteries, when vessel segments are accessible. Bilateral testing is considered an integral part of a complete examination. Photoelectric Plethysmograph (PPG) waveforms and toe systolic pressure readings are included as required and additional duplex testing as needed. Limited examinations for reoccurring indications may be performed as noted.  ABI Findings: +---------+------------------+-----+---------+--------+ Right    Rt Pressure (mmHg)IndexWaveform Comment  +---------+------------------+-----+---------+--------+ Brachial 151                                      +---------+------------------+-----+---------+--------+ PTA      169               1.10 triphasic         +---------+------------------+-----+---------+--------+ DP       163               1.06 biphasic          +---------+------------------+-----+---------+--------+ Giles Labrum                0.56 Normal            +---------+------------------+-----+---------+--------+ +---------+------------------+-----+----------+-------+ Left     Lt Pressure (mmHg)IndexWaveform  Comment +---------+------------------+-----+----------+-------+ Brachial 154                                      +---------+------------------+-----+----------+-------+ PTA      155               1.01 biphasic          +---------+------------------+-----+----------+-------+ DP       150               0.97 monophasic        +---------+------------------+-----+----------+-------+ Great Toe91                0.59 Normal             +---------+------------------+-----+----------+-------+ +-------+-----------+-----------+------------+------------+ ABI/TBIToday's ABIToday's TBIPrevious ABIPrevious TBI +-------+-----------+-----------+------------+------------+ Right  1.10       .56        1.28        .70          +-------+-----------+-----------+------------+------------+ Left   1.01       .59        .86         .67          +-------+-----------+-----------+------------+------------+  Left ABIs appear increased compared to prior study on 12/2023.  Summary: Right: Resting right ankle-brachial index is within normal range. The right toe-brachial index is abnormal. Left: Resting left ankle-brachial index is within normal range. The left toe-brachial index is abnormal. *See table(s) above for measurements and observations.  Electronically signed by Devon Fogo MD on 04/14/2024 at 8:22:12 AM.    Final        Assessment & Plan:   1. Right arm pain (Primary) Today the pain and discomfort that she is having in her right upper extremity are not related to vascular issues.  She has widely patent vessels from her subclavian to  her radial and ulnar vessels.  I suspect that because this happened after recent shot that there may have been irritation of the nerve during the injection.  She currently has an upcoming evaluation with neurology.  2. Benign essential hypertension Continue antihypertensive medications as already ordered, these medications have been reviewed and there are no changes at this time.  3. Occlusion and stenosis of right vertebral artery The patient has had known occlusion of her right vertebral artery for at least the last couple of years.  Her most recent studies were stable.  Will continue to follow the patient on a 69-month basis.  Late to assess vertebral stenosis.  She also does not exhibit any evidence of ultrasound mildly patent subclavian vessel.  Current Outpatient Medications on File Prior to  Visit  Medication Sig Dispense Refill   acetaminophen  (TYLENOL ) 500 MG tablet Take 500 mg by mouth every 6 (six) hours as needed.     aspirin  EC 81 MG tablet Take 81 mg by mouth daily.     atorvastatin  (LIPITOR) 40 MG tablet Take 1 tablet by mouth daily.     calcium  carbonate (OSCAL) 1500 (600 Ca) MG TABS tablet Take 1,500 mg by mouth daily with breakfast.     ciprofloxacin-dexamethasone  (CIPRODEX) OTIC suspension SMARTSIG:4 In Ear(s) Twice Daily     clopidogrel  (PLAVIX ) 75 MG tablet Take 1 tablet by mouth daily.     clotrimazole -betamethasone  (LOTRISONE ) cream Apply topically 2 (two) times daily.     cyanocobalamin (VITAMIN B12) 1000 MCG tablet Take 1 tablet by mouth daily.     dolutegravir -lamiVUDine (DOVATO ) 50-300 MG tablet Take 1 tablet by mouth daily. 30 tablet 5   enalapril  (VASOTEC ) 10 MG tablet Take 2 tablets by mouth 2 (two) times daily.     estradiol  (ESTRACE ) 0.1 MG/GM vaginal cream Place 0.5 application vaginally once a week.     fluticasone  (FLONASE ) 50 MCG/ACT nasal spray Place 1 spray into both nostrils daily.     metFORMIN  (GLUCOPHAGE ) 500 MG tablet Take 1 tablet by mouth 2 (two) times daily with a meal.     Multiple Vitamins-Minerals (ALIVE ADULT PREMIUM) CHEW Chew 2 tablets by mouth daily.     ONETOUCH VERIO test strip 2 (two) times daily.     triamcinolone  cream (KENALOG ) 0.1 % Apply 1 Application topically 2 (two) times daily.     acetic acid-hydrocortisone (VOSOL-HC) OTIC solution Place 4 drops in ear(s) 2 (two) times a week. (Patient not taking: Reported on 01/10/2024)     amLODipine (NORVASC) 5 MG tablet Take 10 mg by mouth daily. (Patient not taking: Reported on 01/10/2024)     amLODipine (NORVASC) 5 MG tablet Take 2 tablets by mouth daily. (Patient not taking: Reported on 04/15/2024)     Fluocinolone Acetonide 0.01 % OIL Place 2 drops in ear(s) at bedtime. (Patient not taking: Reported on 01/10/2024)     gabapentin  (NEURONTIN ) 300 MG capsule Take 300 mg by mouth at  bedtime. (Patient not taking: Reported on 01/10/2024)     GAVILAX 17 GM/SCOOP powder SMARTSIG:17 Gram(s) By Mouth Daily PRN (Patient not taking: Reported on 01/10/2024)     metoprolol  tartrate (LOPRESSOR ) 25 MG tablet Take 25 mg by mouth 2 (two) times daily.  (Patient not taking: Reported on 04/10/2024)     Polyethyl Glycol-Propyl Glycol (SYSTANE) 0.4-0.3 % SOLN Apply 1 drop to eye daily as needed. (Patient not taking: Reported on 01/10/2024)     No current facility-administered medications on file prior to visit.  There are no Patient Instructions on file for this visit. No follow-ups on file.   Estela Vinal E Eliel Dudding, NP

## 2024-04-22 ENCOUNTER — Encounter (INDEPENDENT_AMBULATORY_CARE_PROVIDER_SITE_OTHER): Payer: Self-pay | Admitting: Vascular Surgery

## 2024-04-29 ENCOUNTER — Other Ambulatory Visit (HOSPITAL_COMMUNITY): Payer: Self-pay

## 2024-04-29 ENCOUNTER — Other Ambulatory Visit
Admission: RE | Admit: 2024-04-29 | Discharge: 2024-04-29 | Disposition: A | Discharge status: Home / Self Care | Source: Ambulatory Visit | Attending: Infectious Diseases | Admitting: Infectious Diseases

## 2024-04-29 ENCOUNTER — Ambulatory Visit: Attending: Infectious Diseases | Admitting: Infectious Diseases

## 2024-04-29 ENCOUNTER — Encounter: Payer: Self-pay | Admitting: Infectious Diseases

## 2024-04-29 VITALS — BP 159/85 | HR 86 | Temp 98.3°F | Ht 64.0 in | Wt 141.0 lb

## 2024-04-29 DIAGNOSIS — E119 Type 2 diabetes mellitus without complications: Secondary | ICD-10-CM | POA: Diagnosis present

## 2024-04-29 DIAGNOSIS — Z8673 Personal history of transient ischemic attack (TIA), and cerebral infarction without residual deficits: Secondary | ICD-10-CM | POA: Insufficient documentation

## 2024-04-29 DIAGNOSIS — I10 Essential (primary) hypertension: Secondary | ICD-10-CM | POA: Insufficient documentation

## 2024-04-29 DIAGNOSIS — Z87891 Personal history of nicotine dependence: Secondary | ICD-10-CM | POA: Insufficient documentation

## 2024-04-29 DIAGNOSIS — Z79899 Other long term (current) drug therapy: Secondary | ICD-10-CM | POA: Insufficient documentation

## 2024-04-29 DIAGNOSIS — Z7902 Long term (current) use of antithrombotics/antiplatelets: Secondary | ICD-10-CM | POA: Insufficient documentation

## 2024-04-29 DIAGNOSIS — I2510 Atherosclerotic heart disease of native coronary artery without angina pectoris: Secondary | ICD-10-CM | POA: Diagnosis not present

## 2024-04-29 DIAGNOSIS — I6509 Occlusion and stenosis of unspecified vertebral artery: Secondary | ICD-10-CM | POA: Insufficient documentation

## 2024-04-29 DIAGNOSIS — B20 Human immunodeficiency virus [HIV] disease: Secondary | ICD-10-CM

## 2024-04-29 DIAGNOSIS — H9192 Unspecified hearing loss, left ear: Secondary | ICD-10-CM | POA: Insufficient documentation

## 2024-04-29 DIAGNOSIS — C884 Extranodal marginal zone b-cell lymphoma of mucosa-associated lymphoid tissue (malt-lymphoma) not having achieved remission: Secondary | ICD-10-CM | POA: Insufficient documentation

## 2024-04-29 DIAGNOSIS — Z21 Asymptomatic human immunodeficiency virus [HIV] infection status: Secondary | ICD-10-CM | POA: Insufficient documentation

## 2024-04-29 DIAGNOSIS — G629 Polyneuropathy, unspecified: Secondary | ICD-10-CM | POA: Insufficient documentation

## 2024-04-29 DIAGNOSIS — E785 Hyperlipidemia, unspecified: Secondary | ICD-10-CM | POA: Diagnosis not present

## 2024-04-29 DIAGNOSIS — Z79624 Long term (current) use of inhibitors of nucleotide synthesis: Secondary | ICD-10-CM | POA: Insufficient documentation

## 2024-04-29 DIAGNOSIS — Z7984 Long term (current) use of oral hypoglycemic drugs: Secondary | ICD-10-CM | POA: Insufficient documentation

## 2024-04-29 LAB — COMPREHENSIVE METABOLIC PANEL WITH GFR
ALT: 22 U/L (ref 0–44)
AST: 25 U/L (ref 15–41)
Albumin: 4.1 g/dL (ref 3.5–5.0)
Alkaline Phosphatase: 57 U/L (ref 38–126)
Anion gap: 11 (ref 5–15)
BUN: 15 mg/dL (ref 8–23)
CO2: 23 mmol/L (ref 22–32)
Calcium: 10 mg/dL (ref 8.9–10.3)
Chloride: 103 mmol/L (ref 98–111)
Creatinine, Ser: 0.93 mg/dL (ref 0.44–1.00)
GFR, Estimated: >60 mL/min (ref >60)
Glucose, Bld: 156 mg/dL — ABNORMAL HIGH (ref 70–99)
Potassium: 3.4 mmol/L — ABNORMAL LOW (ref 3.5–5.1)
Sodium: 137 mmol/L (ref 135–145)
Total Bilirubin: 1 mg/dL (ref 0.0–1.2)
Total Protein: 7.6 g/dL (ref 6.5–8.1)

## 2024-04-29 MED ORDER — DOVATO 50-300 MG PO TABS: 1.0000 | ORAL_TABLET | Freq: Every day | ORAL | 5 refills | Status: DC
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
## 2024-04-30 LAB — T-HELPER CELLS CD4/CD8 %
% CD 4 Pos. Lymph.: 26.4 % — ABNORMAL LOW (ref 30.8–58.5)
Absolute CD 4 Helper: 818 /uL (ref 359–1519)
Basophils Absolute: 0.1 10*3/uL (ref 0.0–0.2)
Basos: 1 %
CD3+CD4+ Cells/CD3+CD8+ Cells Bld: 0.49 — ABNORMAL LOW (ref 0.92–3.72)
CD3+CD8+ Cells # Bld: 1671 /uL — ABNORMAL HIGH (ref 109–897)
CD3+CD8+ Cells NFr Bld: 53.9 % — ABNORMAL HIGH (ref 12.0–35.5)
EOS (ABSOLUTE): 0.1 10*3/uL (ref 0.0–0.4)
Eos: 1 %
Hematocrit: 36.8 % (ref 34.0–46.6)
Hemoglobin: 12 g/dL (ref 11.1–15.9)
Immature Grans (Abs): 0 10*3/uL (ref 0.0–0.1)
Immature Granulocytes: 0 %
Lymphocytes Absolute: 3.1 10*3/uL (ref 0.7–3.1)
Lymphs: 38 %
MCH: 31.8 pg (ref 26.6–33.0)
MCHC: 32.6 g/dL (ref 31.5–35.7)
MCV: 98 fL — ABNORMAL HIGH (ref 79–97)
Monocytes Absolute: 0.6 10*3/uL (ref 0.1–0.9)
Monocytes: 8 %
Neutrophils Absolute: 4.3 10*3/uL (ref 1.4–7.0)
Neutrophils: 52 %
Platelets: 278 10*3/uL (ref 150–450)
RBC: 3.77 x10E6/uL (ref 3.77–5.28)
RDW: 14.3 % (ref 11.7–15.4)
WBC: 8.1 10*3/uL (ref 3.4–10.8)

## 2024-04-30 LAB — HIV-1 RNA QUANT-NO REFLEX-BLD
HIV 1 RNA Quant: <20 {copies}/mL
LOG10 HIV-1 RNA: UNDETERMINED {Log_copies}/mL

## 2024-05-04 ENCOUNTER — Other Ambulatory Visit
Admission: RE | Admit: 2024-05-04 | Discharge: 2024-05-04 | Disposition: A | Discharge status: Home / Self Care | Source: Ambulatory Visit | Attending: Internal Medicine | Admitting: Internal Medicine

## 2024-05-04 DIAGNOSIS — E1169 Type 2 diabetes mellitus with other specified complication: Secondary | ICD-10-CM | POA: Insufficient documentation

## 2024-05-05 ENCOUNTER — Ambulatory Visit: Payer: Self-pay

## 2024-05-05 NOTE — Telephone Encounter (Signed)
-----   Message from Alica Inks sent at 05/05/2024 10:06 AM EDT ----- Regarding: FW: Please let her know that her cd 4 is > 800 and VL is < 20. ----- Message ----- From: Dannis Dy, Lab In [place] Sent: 04/29/2024  12:33 PM EDT To: Alica Inks, MD

## 2024-05-06 ENCOUNTER — Ambulatory Visit: Payer: Medicare HMO | Admitting: Infectious Diseases

## 2024-05-06 LAB — MICROALBUMIN / CREATININE URINE RATIO
Creatinine, Urine: 64.9 mg/dL
Microalb Creat Ratio: 1204 mg/g{creat} — ABNORMAL HIGH (ref 0–29)
Microalb, Ur: 781.6 ug/mL — ABNORMAL HIGH
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
## 2024-07-15 ENCOUNTER — Ambulatory Visit: Attending: Internal Medicine

## 2024-07-15 DIAGNOSIS — M542 Cervicalgia: Secondary | ICD-10-CM | POA: Insufficient documentation

## 2024-07-15 DIAGNOSIS — M6281 Muscle weakness (generalized): Secondary | ICD-10-CM | POA: Insufficient documentation

## 2024-07-21 ENCOUNTER — Ambulatory Visit

## 2024-07-21 DIAGNOSIS — M542 Cervicalgia: Secondary | ICD-10-CM

## 2024-07-21 DIAGNOSIS — M6281 Muscle weakness (generalized): Secondary | ICD-10-CM
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
## 2024-07-23 ENCOUNTER — Ambulatory Visit

## 2024-07-23 DIAGNOSIS — M542 Cervicalgia: Secondary | ICD-10-CM | POA: Diagnosis not present

## 2024-07-23 DIAGNOSIS — M6281 Muscle weakness (generalized): Secondary | ICD-10-CM
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
## 2024-07-30 ENCOUNTER — Ambulatory Visit

## 2024-07-30 DIAGNOSIS — M542 Cervicalgia: Secondary | ICD-10-CM

## 2024-07-30 DIAGNOSIS — M6281 Muscle weakness (generalized): Secondary | ICD-10-CM

## 2024-08-05 ENCOUNTER — Ambulatory Visit: Attending: Internal Medicine

## 2024-08-05 DIAGNOSIS — M542 Cervicalgia: Secondary | ICD-10-CM | POA: Insufficient documentation

## 2024-08-05 DIAGNOSIS — M6281 Muscle weakness (generalized): Secondary | ICD-10-CM | POA: Insufficient documentation
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
## 2024-08-07 ENCOUNTER — Ambulatory Visit

## 2024-08-07 DIAGNOSIS — M542 Cervicalgia: Secondary | ICD-10-CM

## 2024-08-07 DIAGNOSIS — M6281 Muscle weakness (generalized): Secondary | ICD-10-CM

## 2024-08-08 ENCOUNTER — Other Ambulatory Visit: Payer: Self-pay | Admitting: Otolaryngology

## 2024-08-08 DIAGNOSIS — H90A32 Mixed conductive and sensorineural hearing loss, unilateral, left ear with restricted hearing on the contralateral side: Secondary | ICD-10-CM

## 2024-08-08 DIAGNOSIS — H7390 Unspecified disorder of tympanic membrane, unspecified ear: Secondary | ICD-10-CM
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
## 2024-08-12 ENCOUNTER — Ambulatory Visit

## 2024-08-12 DIAGNOSIS — M542 Cervicalgia: Secondary | ICD-10-CM

## 2024-08-12 DIAGNOSIS — M6281 Muscle weakness (generalized): Secondary | ICD-10-CM

## 2024-08-13 ENCOUNTER — Ambulatory Visit: Payer: Medicare HMO | Admitting: Dermatology
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
## 2024-08-14 ENCOUNTER — Ambulatory Visit

## 2024-08-14 DIAGNOSIS — M542 Cervicalgia: Secondary | ICD-10-CM

## 2024-08-14 DIAGNOSIS — M6281 Muscle weakness (generalized): Secondary | ICD-10-CM

## 2024-08-15 ENCOUNTER — Ambulatory Visit
Admission: RE | Admit: 2024-08-15 | Discharge: 2024-08-15 | Disposition: A | Discharge status: Home / Self Care | Source: Ambulatory Visit | Attending: Otolaryngology | Admitting: Otolaryngology

## 2024-08-15 DIAGNOSIS — H7390 Unspecified disorder of tympanic membrane, unspecified ear: Secondary | ICD-10-CM

## 2024-08-15 DIAGNOSIS — H90A32 Mixed conductive and sensorineural hearing loss, unilateral, left ear with restricted hearing on the contralateral side: Secondary | ICD-10-CM

## 2024-08-19 ENCOUNTER — Ambulatory Visit

## 2024-08-21 ENCOUNTER — Ambulatory Visit

## 2024-08-21 DIAGNOSIS — R202 Paresthesia of skin: Secondary | ICD-10-CM | POA: Diagnosis not present

## 2024-08-21 DIAGNOSIS — L853 Xerosis cutis: Secondary | ICD-10-CM | POA: Diagnosis not present

## 2024-08-21 DIAGNOSIS — D229 Melanocytic nevi, unspecified: Secondary | ICD-10-CM

## 2024-08-21 DIAGNOSIS — Z1283 Encounter for screening for malignant neoplasm of skin: Secondary | ICD-10-CM

## 2024-08-21 DIAGNOSIS — L299 Pruritus, unspecified: Secondary | ICD-10-CM

## 2024-08-21 MED ORDER — CLOBETASOL PROPIONATE 0.05 % EX SOLN: CUTANEOUS | 5 refills | Status: AC

## 2024-08-21 NOTE — Progress Notes (Signed)
    Subjective   Janet Keller is a 83 y.o. female who presents for the following: Total body skin exam for skin cancer screening and mole check. The patient has spots, moles and lesions to be evaluated, some may be new or changing and the patient may have concern these could be cancer.. Patient is new patient  Today patient reports: TBSE Itchy skin on back, arms and legs  Using tmc 01 ointment for itchy scalp. Wears a wig.    Review of Systems:    No other skin or systemic complaints except as noted in HPI or Assessment and Plan.  The following portions of the chart were reviewed this encounter and updated as appropriate: medications, allergies, medical history  Relevant Medical History:  reviewed  Objective  Well appearing patient in no apparent distress; mood and affect are within normal limits. Examination was performed of the: Full Skin Examination: scalp, head, eyes, ears, nose, lips, neck, chest, axillae, abdomen, back, buttocks, bilateral upper extremities, bilateral lower extremities, hands, feet, fingers, toes, fingernails, and toenails.   Examination notable for: SKIN EXAM, Nevus/nevi: Scattered well-demarcated, regular, pigmented macule(s) and/or papule(s)  , Xerosis: Diffuse xerosis and scale accentuated on the extremities   Examination limited by: Undergarments     Assessment & Plan   SKIN CANCER SCREENING PERFORMED TODAY.  BENIGN SKIN FINDINGS  - Nevus/Multiple Benign Nevi - Reassurance provided regarding the benign appearance of lesions noted on exam today; no treatment is indicated in the absence of symptoms/changes. - Reinforced importance of photoprotective strategies including liberal and frequent sunscreen use of a broad-spectrum SPF 30 or greater, use of protective clothing, and sun avoidance for prevention of cutaneous malignancy and photoaging.  Counseled patient on the importance of regular self-skin monitoring as well as routine clinical skin  examinations as scheduled.   Xerosis cutis - Discussed diagnosis, typical course, and treatment options for this condition - The patient was advised to use a gentle cleanser such as Dove or Cetaphil and to avoid anti-bacterial soaps which may be too irritating. We recommended the frequent use of a greasy emollient such as Cetaphil, Cerave or Vaseline to moisturize the skin once to twice daily. We also recommended the avoidance of scratching and irritants to the skin.  Notalgia paresthetica - Discussed diagnosis and etiology of this condition related to nerve compression leading to itch - Reassured patient of benign nature of this condition and that no treatment is necessary - May try sarna lotion PRN, capsaicin 0.025-0.075% 3-5 times daily, or gabapentin  - Start clobetasol  ointment 0.05% twice daily to affected skin Discussed side effect of super potent topical steroids including atrophy, dyspigmentation, striae, telangectasia, folliculitis, loss of skin pigment, hair growth, tachyphylaxis, risk of systemic absorption with missuse. - Recommended discussing lower dose of gabapentin  w/ PCP   Scalp pruritus - Suspect some irritation from wig  - Start clobetasol  solution 0.05% twice daily to affected skin - Discussed side effect of super potent topical steroids including atrophy, dyspigmentation, striae, telangectasia, folliculitis, loss of skin pigment, hair growth, tachyphylaxis, risk of systemic absorption with missuse.  Procedures, orders, diagnosis for this visit:    There are no diagnoses linked to this encounter.  Return to clinic: No follow-ups on file.  Documentation: I have reviewed the above documentation for accuracy and completeness, and I agree with the above.  I, Eleanor Blush, CMA, am acting as scribe for Lauraine JAYSON Kanaris, MD.  Lauraine JAYSON Kanaris, MD
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
## 2024-08-21 NOTE — Patient Instructions (Addendum)
 For itch at scalp  Start clobetasol  solution 0.05 apply to affected areas twice daily as needed.   Topical steroids (such as triamcinolone , fluocinolone, fluocinonide, mometasone, clobetasol , halobetasol, betamethasone , hydrocortisone) can cause thinning and lightening of the skin if they are used for too long in the same area. Your physician has selected the right strength medicine for your problem and area affected on the body. Please use your medication only as directed by your physician to prevent side effects.    Recommend talking to primary care doctor about neuropathy at feet and lower extremities  Sarna Sensitive Skin Lotion and CeraVe Itch Relief Cream are both lotions/creams that contains pramoxine, which can help with itching. It is safe to use as many times daily as necessary. Apply directly to the itchy areas.       If You Need Anything After Your Visit  If you have any questions or concerns for your doctor, please call our main line at [phone] and press option 4 to reach your doctor's medical assistant. If no one answers, please leave a voicemail as directed and we will return your call as soon as possible. Messages left after 4 pm will be answered the following business day.   You may also send us  a message via MyChart. We typically respond to MyChart messages within 1-2 business days.  For prescription refills, please ask your pharmacy to contact our office. Our fax number is [phone].  If you have an urgent issue when the clinic is closed that cannot wait until the next business day, you can page your doctor at the number below.    Please note that while we do our best to be available for urgent issues outside of office hours, we are not available 24/7.   If you have an urgent issue and are unable to reach us , you may choose to seek medical care at your doctor's office, retail clinic, urgent care center, or emergency room.  If you have a medical emergency,  please immediately call 911 or go to the emergency department.  Pager Numbers  - Dr. Hester: [phone]  - Dr. Jackquline: [phone]  - Dr. Claudene: [phone]   - Dr. Raymund: [phone]  In the event of inclement weather, please call our main line at [phone] for an update on the status of any delays or closures.  Dermatology Medication Tips: Please keep the boxes that topical medications come in in order to help keep track of the instructions about where and how to use these. Pharmacies typically print the medication instructions only on the boxes and not directly on the medication tubes.   If your medication is too expensive, please contact our office at [phone] option 4 or send us  a message through MyChart.   We are unable to tell what your co-pay for medications will be in advance as this is different depending on your insurance coverage. However, we may be able to find a substitute medication at lower cost or fill out paperwork to get insurance to cover a needed medication.   If a prior authorization is required to get your medication covered by your insurance company, please allow us  1-2 business days to complete this process.  Drug prices often vary depending on where the prescription is filled and some pharmacies may offer cheaper prices.  The website www.goodrx.com contains coupons for medications through different pharmacies. The prices here do not account for what the cost may be with help from insurance (it may be cheaper with your insurance),  but the website can give you the price if you did not use any insurance.  - You can print the associated coupon and take it with your prescription to the pharmacy.  - You may also stop by our office during regular business hours and pick up a GoodRx coupon card.  - If you need your prescription sent electronically to a different pharmacy, notify our office through [facility] or by phone at [phone] option  4.     Si Usted Necesita Algo Despus de Su Visita  Tambin puede enviarnos un mensaje a travs de Clinical cytogeneticist. Por lo general respondemos a los mensajes de MyChart en el transcurso de 1 a 2 das hbiles.  Para renovar recetas, por favor pida a su farmacia que se ponga en contacto con nuestra oficina. Randi lakes de fax es [place] [phone].  Si tiene un asunto urgente cuando la clnica est cerrada y que no puede esperar hasta el siguiente da hbil, puede llamar/localizar a su doctor(a) al nmero que aparece a continuacin.   Por favor, tenga en cuenta que aunque hacemos todo lo posible para estar disponibles para asuntos urgentes fuera del horario de [place], no estamos disponibles las 24 horas del da, los 7 [address]  [address] de la [place].   Si tiene un problema urgente y no puede comunicarse con nosotros, puede optar por buscar atencin mdica  en el consultorio de su doctor(a), en una clnica privada, en un centro de atencin urgente o en una sala de emergencias.  Si tiene Engineer, drilling, por favor llame inmediatamente al 911 o vaya a la sala de emergencias.  Nmeros de bper  - Dr. Hester: [phone]  - Dra. Jackquline: [phone]  - Dr. Claudene: [phone]  - Dra. Kitts: [phone]  En caso de inclemencias del [place], por favor llame a nuestra lnea principal al [phone] para una actualizacin sobre el estado de cualquier retraso o cierre.  Consejos para la medicacin en dermatologa: Por favor, guarde las cajas en las que vienen los medicamentos de uso tpico para ayudarle a seguir las instrucciones sobre dnde y cmo usarlos. Las farmacias generalmente imprimen las instrucciones del medicamento slo en las cajas y no directamente en los tubos del [place].   Si su medicamento es muy caro, por favor, pngase en contacto con landry rieger llamando al [phone] y presione la opcin 4 o envenos un mensaje a travs de Clinical cytogeneticist.   No podemos decirle cul ser su copago  por los medicamentos por adelantado ya que esto es diferente dependiendo de la cobertura de su seguro. Sin embargo, es posible que podamos encontrar un medicamento sustituto a Audiological scientist un formulario para que el seguro cubra el medicamento que se considera necesario.   Si se requiere una autorizacin previa para que su compaa de seguros malta su medicamento, por favor permtanos de 1 a 2 das hbiles para completar este proceso.  Los precios de los medicamentos varan con frecuencia dependiendo del Environmental consultant de dnde se surte la receta y alguna farmacias pueden ofrecer precios ms baratos.  El sitio web www.goodrx.com tiene cupones para medicamentos de Health and safety inspector. Los precios aqu no tienen en cuenta lo que podra costar con la ayuda del seguro (puede ser ms barato con su seguro), pero el sitio web puede darle el precio si no utiliz Tourist information centre manager.  - Puede imprimir el cupn correspondiente y llevarlo con su receta a la farmacia.  - Tambin puede pasar por nuestra oficina durante el horario de atencin regular y  recoger una tarjeta de cupones de GoodRx.  - Si necesita que su receta se enve electrnicamente a una farmacia diferente, informe a nuestra oficina a travs de MyChart de  o por telfono llamando al [phone] y presione la opcin 4.

## 2024-08-25 ENCOUNTER — Ambulatory Visit

## 2024-08-25 DIAGNOSIS — M542 Cervicalgia: Secondary | ICD-10-CM | POA: Diagnosis not present

## 2024-08-25 DIAGNOSIS — M6281 Muscle weakness (generalized): Secondary | ICD-10-CM
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
## 2024-08-27 ENCOUNTER — Ambulatory Visit: Attending: Internal Medicine

## 2024-08-27 DIAGNOSIS — M542 Cervicalgia: Secondary | ICD-10-CM | POA: Diagnosis present

## 2024-08-27 DIAGNOSIS — M6281 Muscle weakness (generalized): Secondary | ICD-10-CM | POA: Diagnosis present
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
## 2024-09-01 ENCOUNTER — Ambulatory Visit

## 2024-09-01 DIAGNOSIS — M6281 Muscle weakness (generalized): Secondary | ICD-10-CM | POA: Diagnosis not present

## 2024-09-01 DIAGNOSIS — M542 Cervicalgia: Secondary | ICD-10-CM

## 2024-09-03 ENCOUNTER — Ambulatory Visit

## 2024-09-08 ENCOUNTER — Encounter

## 2024-09-09 ENCOUNTER — Encounter

## 2024-09-11 ENCOUNTER — Encounter

## 2024-10-03 ENCOUNTER — Telehealth: Payer: Self-pay

## 2024-10-03 NOTE — Telephone Encounter (Signed)
 Patient son Janet Keller called to review lab results from June. Would like to discuss abnormal results seen on mychart. Informed him CD4 is <800 and that glucose is was 156. Janet Keller is asking for clarification regarding CD3+CD8 count and %cd4 pos lymph and if anything needs to be done based off values. Informed Janet Keller that since cd4 is at healthy rate no changes needed at this time. Also understands provider will results in more depth at appt. Lorenda CHRISTELLA Code, RMA

## 2024-10-16 ENCOUNTER — Ambulatory Visit (INDEPENDENT_AMBULATORY_CARE_PROVIDER_SITE_OTHER): Admitting: Nurse Practitioner

## 2024-10-16 ENCOUNTER — Encounter (INDEPENDENT_AMBULATORY_CARE_PROVIDER_SITE_OTHER)

## 2024-10-28 ENCOUNTER — Ambulatory Visit: Attending: Infectious Diseases | Admitting: Infectious Diseases

## 2024-10-28 ENCOUNTER — Encounter: Payer: Self-pay | Admitting: Infectious Diseases

## 2024-10-28 ENCOUNTER — Other Ambulatory Visit
Admission: RE | Admit: 2024-10-28 | Discharge: 2024-10-28 | Disposition: A | Source: Ambulatory Visit | Attending: Infectious Diseases | Admitting: Infectious Diseases

## 2024-10-28 VITALS — BP 150/76 | HR 72 | Temp 96.8°F | Ht 64.0 in | Wt 130.0 lb

## 2024-10-28 DIAGNOSIS — I6509 Occlusion and stenosis of unspecified vertebral artery: Secondary | ICD-10-CM | POA: Insufficient documentation

## 2024-10-28 DIAGNOSIS — I10 Essential (primary) hypertension: Secondary | ICD-10-CM | POA: Insufficient documentation

## 2024-10-28 DIAGNOSIS — Z21 Asymptomatic human immunodeficiency virus [HIV] infection status: Secondary | ICD-10-CM | POA: Insufficient documentation

## 2024-10-28 DIAGNOSIS — Z79899 Other long term (current) drug therapy: Secondary | ICD-10-CM | POA: Insufficient documentation

## 2024-10-28 DIAGNOSIS — Z7984 Long term (current) use of oral hypoglycemic drugs: Secondary | ICD-10-CM | POA: Insufficient documentation

## 2024-10-28 DIAGNOSIS — H9319 Tinnitus, unspecified ear: Secondary | ICD-10-CM | POA: Insufficient documentation

## 2024-10-28 DIAGNOSIS — Z7902 Long term (current) use of antithrombotics/antiplatelets: Secondary | ICD-10-CM | POA: Insufficient documentation

## 2024-10-28 DIAGNOSIS — H9192 Unspecified hearing loss, left ear: Secondary | ICD-10-CM | POA: Insufficient documentation

## 2024-10-28 DIAGNOSIS — Z87891 Personal history of nicotine dependence: Secondary | ICD-10-CM | POA: Insufficient documentation

## 2024-10-28 DIAGNOSIS — B20 Human immunodeficiency virus [HIV] disease: Secondary | ICD-10-CM

## 2024-10-28 DIAGNOSIS — Z8673 Personal history of transient ischemic attack (TIA), and cerebral infarction without residual deficits: Secondary | ICD-10-CM | POA: Insufficient documentation

## 2024-10-28 DIAGNOSIS — E785 Hyperlipidemia, unspecified: Secondary | ICD-10-CM | POA: Insufficient documentation

## 2024-10-28 DIAGNOSIS — I2510 Atherosclerotic heart disease of native coronary artery without angina pectoris: Secondary | ICD-10-CM | POA: Insufficient documentation

## 2024-10-28 DIAGNOSIS — Z79624 Long term (current) use of inhibitors of nucleotide synthesis: Secondary | ICD-10-CM | POA: Insufficient documentation

## 2024-10-28 DIAGNOSIS — E1142 Type 2 diabetes mellitus with diabetic polyneuropathy: Secondary | ICD-10-CM | POA: Insufficient documentation

## 2024-10-28 LAB — COMPREHENSIVE METABOLIC PANEL WITH GFR
ALT: 16 U/L (ref 0–44)
AST: 20 U/L (ref 15–41)
Albumin: 4.3 g/dL (ref 3.5–5.0)
Alkaline Phosphatase: 69 U/L (ref 38–126)
Anion gap: 13 (ref 5–15)
BUN: 12 mg/dL (ref 8–23)
CO2: 23 mmol/L (ref 22–32)
Calcium: 9.8 mg/dL (ref 8.9–10.3)
Chloride: 101 mmol/L (ref 98–111)
Creatinine, Ser: 0.91 mg/dL (ref 0.44–1.00)
GFR, Estimated: >60 mL/min (ref >60)
Glucose, Bld: 133 mg/dL — ABNORMAL HIGH (ref 70–99)
Potassium: 3.9 mmol/L (ref 3.5–5.1)
Sodium: 137 mmol/L (ref 135–145)
Total Bilirubin: 0.6 mg/dL (ref 0.0–1.2)
Total Protein: 7.6 g/dL (ref 6.5–8.1)

## 2024-10-28 LAB — CBC WITH DIFFERENTIAL/PLATELET
Abs Immature Granulocytes: 0.01 K/uL (ref 0.00–0.07)
Basophils Absolute: 0.1 K/uL (ref 0.0–0.1)
Basophils Relative: 1 %
Eosinophils Absolute: 0 K/uL (ref 0.0–0.5)
Eosinophils Relative: 1 %
HCT: 31.5 % — ABNORMAL LOW (ref 36.0–46.0)
Hemoglobin: 10.7 g/dL — ABNORMAL LOW (ref 12.0–15.0)
Immature Granulocytes: 0 %
Lymphocytes Relative: 39 %
Lymphs Abs: 2.3 K/uL (ref 0.7–4.0)
MCH: 31.5 pg (ref 26.0–34.0)
MCHC: 34 g/dL (ref 30.0–36.0)
MCV: 92.6 fL (ref 80.0–100.0)
Monocytes Absolute: 0.3 K/uL (ref 0.1–1.0)
Monocytes Relative: 6 %
Neutro Abs: 3.2 K/uL (ref 1.7–7.7)
Neutrophils Relative %: 53 %
Platelets: 300 K/uL (ref 150–400)
RBC: 3.4 MIL/uL — ABNORMAL LOW (ref 3.87–5.11)
RDW: 14.6 % (ref 11.5–15.5)
WBC: 6 K/uL (ref 4.0–10.5)
nRBC: 0 % (ref 0.0–0.2)

## 2024-10-28 MED ORDER — DOVATO 50-300 MG PO TABS: 1.0000 | ORAL_TABLET | Freq: Every day | ORAL | 5 refills | Status: AC
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
## 2024-10-29 LAB — T-HELPER CELLS CD4/CD8 %
% CD 4 Pos. Lymph.: 27.2 % — ABNORMAL LOW (ref 30.8–58.5)
Absolute CD 4 Helper: 653 /uL (ref 359–1519)
Basophils Absolute: 0 x10E3/uL (ref 0.0–0.2)
Basos: 1 %
CD3+CD4+ Cells/CD3+CD8+ Cells Bld: 0.54 — ABNORMAL LOW (ref 0.92–3.72)
CD3+CD8+ Cells # Bld: 1217 /uL — ABNORMAL HIGH (ref 109–897)
CD3+CD8+ Cells NFr Bld: 50.7 % — ABNORMAL HIGH (ref 12.0–35.5)
EOS (ABSOLUTE): 0 x10E3/uL (ref 0.0–0.4)
Eos: 1 %
Hematocrit: 32.6 % — ABNORMAL LOW (ref 34.0–46.6)
Hemoglobin: 10.9 g/dL — ABNORMAL LOW (ref 11.1–15.9)
Immature Grans (Abs): 0 x10E3/uL (ref 0.0–0.1)
Immature Granulocytes: 0 %
Lymphocytes Absolute: 2.4 x10E3/uL (ref 0.7–3.1)
Lymphs: 40 %
MCH: 31.4 pg (ref 26.6–33.0)
MCHC: 33.4 g/dL (ref 31.5–35.7)
MCV: 94 fL (ref 79–97)
Monocytes Absolute: 0.3 x10E3/uL (ref 0.1–0.9)
Monocytes: 6 %
Neutrophils Absolute: 3.1 x10E3/uL (ref 1.4–7.0)
Neutrophils: 52 %
Platelets: 316 x10E3/uL (ref 150–450)
RBC: 3.47 x10E6/uL — ABNORMAL LOW (ref 3.77–5.28)
RDW: 15 % (ref 11.7–15.4)
WBC: 5.9 x10E3/uL (ref 3.4–10.8)

## 2024-10-29 LAB — HIV-1 RNA QUANT-NO REFLEX-BLD
HIV 1 RNA Quant: <20 {copies}/mL
LOG10 HIV-1 RNA: UNDETERMINED {Log_copies}/mL

## 2024-10-29 LAB — SYPHILIS: RPR W/REFLEX TO RPR TITER AND TREPONEMAL ANTIBODIES, TRADITIONAL SCREENING AND DIAGNOSIS ALGORITHM: RPR Ser Ql: NONREACTIVE

## 2024-10-30 ENCOUNTER — Ambulatory Visit: Admitting: Infectious Diseases

## 2024-10-30 ENCOUNTER — Ambulatory Visit: Payer: Self-pay

## 2024-10-30 LAB — QUANTIFERON-TB GOLD PLUS (RQFGPL)
QuantiFERON Mitogen Value: >10 [IU]/mL
QuantiFERON Nil Value: 0.16 [IU]/mL
QuantiFERON TB1 Ag Value: 0.16 [IU]/mL
QuantiFERON TB2 Ag Value: 0.17 [IU]/mL

## 2024-10-30 LAB — QUANTIFERON-TB GOLD PLUS: QuantiFERON-TB Gold Plus: NEGATIVE

## 2024-11-07 NOTE — Telephone Encounter (Signed)
 Patient returned call, relayed that Hgb down to 10.5 and that Dr. Fayette recommends further evaluation by her PCP. Relayed that viral load undetectable and CD4 count healthy at over 600. Patient states she has an appointment with her PCP next week.   Telesforo Brosnahan, BSN, RN

## 2024-11-07 NOTE — Telephone Encounter (Signed)
 error

## 2024-12-03 ENCOUNTER — Other Ambulatory Visit
Admission: RE | Admit: 2024-12-03 | Discharge: 2024-12-03 | Disposition: A | Discharge status: Home / Self Care | Attending: Internal Medicine | Admitting: Internal Medicine

## 2024-12-03 DIAGNOSIS — D649 Anemia, unspecified: Secondary | ICD-10-CM | POA: Diagnosis present

## 2024-12-03 LAB — CBC
HCT: 32.4 % — ABNORMAL LOW (ref 36.0–46.0)
Hemoglobin: 10.8 g/dL — ABNORMAL LOW (ref 12.0–15.0)
MCH: 31.1 pg (ref 26.0–34.0)
MCHC: 33.3 g/dL (ref 30.0–36.0)
MCV: 93.4 fL (ref 80.0–100.0)
Platelets: 306 K/uL (ref 150–400)
RBC: 3.47 MIL/uL — ABNORMAL LOW (ref 3.87–5.11)
RDW: 15.3 % (ref 11.5–15.5)
WBC: 8 K/uL (ref 4.0–10.5)
nRBC: 0 % (ref 0.0–0.2)

## 2024-12-31 ENCOUNTER — Other Ambulatory Visit (HOSPITAL_COMMUNITY): Payer: Self-pay

## 2025-01-01 ENCOUNTER — Other Ambulatory Visit (HOSPITAL_COMMUNITY): Payer: Self-pay

## 2025-04-28 ENCOUNTER — Ambulatory Visit: Admitting: Infectious Diseases
